# Patient Record
Sex: Male | Born: 1975 | Race: White | Hispanic: No | Marital: Married | Smoking: Former smoker
Health system: Southern US, Community
[De-identification: ages and names within clinical notes are randomized; demographics above are authoritative.]

## PROBLEM LIST (undated history)

## (undated) DIAGNOSIS — E785 Hyperlipidemia, unspecified: Secondary | ICD-10-CM

## (undated) DIAGNOSIS — I2510 Atherosclerotic heart disease of native coronary artery without angina pectoris: Secondary | ICD-10-CM

## (undated) DIAGNOSIS — I10 Essential (primary) hypertension: Secondary | ICD-10-CM

## (undated) HISTORY — DX: Hyperlipidemia, unspecified: E78.5

## (undated) HISTORY — DX: Atherosclerotic heart disease of native coronary artery without angina pectoris: I25.10

## (undated) HISTORY — DX: Essential (primary) hypertension: I10

---

## 2001-07-22 HISTORY — PX: SHOULDER SURGERY: SHX246

## 2009-01-31 ENCOUNTER — Encounter: Payer: Self-pay | Admitting: Internal Medicine

## 2009-02-02 ENCOUNTER — Inpatient Hospital Stay: Payer: Self-pay | Admitting: Internal Medicine

## 2009-02-14 ENCOUNTER — Encounter: Payer: Self-pay | Admitting: Internal Medicine

## 2009-02-20 ENCOUNTER — Telehealth (INDEPENDENT_AMBULATORY_CARE_PROVIDER_SITE_OTHER): Payer: Self-pay | Admitting: *Deleted

## 2009-02-21 ENCOUNTER — Ambulatory Visit: Payer: Self-pay

## 2009-02-21 ENCOUNTER — Encounter: Payer: Self-pay | Admitting: Cardiovascular Disease

## 2009-02-23 ENCOUNTER — Ambulatory Visit: Payer: Self-pay | Admitting: Internal Medicine

## 2009-02-23 DIAGNOSIS — I10 Essential (primary) hypertension: Secondary | ICD-10-CM | POA: Insufficient documentation

## 2009-02-23 DIAGNOSIS — E785 Hyperlipidemia, unspecified: Secondary | ICD-10-CM | POA: Insufficient documentation

## 2009-02-23 DIAGNOSIS — I2510 Atherosclerotic heart disease of native coronary artery without angina pectoris: Secondary | ICD-10-CM | POA: Insufficient documentation

## 2009-03-22 ENCOUNTER — Encounter: Payer: Self-pay | Admitting: Internal Medicine

## 2009-07-18 ENCOUNTER — Telehealth (INDEPENDENT_AMBULATORY_CARE_PROVIDER_SITE_OTHER): Payer: Self-pay | Admitting: *Deleted

## 2009-07-25 ENCOUNTER — Ambulatory Visit: Payer: Self-pay | Admitting: Internal Medicine

## 2009-08-28 ENCOUNTER — Encounter: Payer: Self-pay | Admitting: Internal Medicine

## 2010-04-27 ENCOUNTER — Encounter: Payer: Self-pay | Admitting: Internal Medicine

## 2010-05-25 ENCOUNTER — Ambulatory Visit: Payer: Self-pay | Admitting: Internal Medicine

## 2010-05-29 ENCOUNTER — Ambulatory Visit: Payer: Self-pay | Admitting: Family Medicine

## 2010-05-29 ENCOUNTER — Encounter: Admission: RE | Admit: 2010-05-29 | Discharge: 2010-05-29 | Payer: Self-pay | Admitting: Family Medicine

## 2010-05-29 DIAGNOSIS — M461 Sacroiliitis, not elsewhere classified: Secondary | ICD-10-CM | POA: Insufficient documentation

## 2010-05-29 DIAGNOSIS — M545 Low back pain, unspecified: Secondary | ICD-10-CM | POA: Insufficient documentation

## 2010-08-21 NOTE — Assessment & Plan Note (Signed)
Summary: NP,BACK PAIN,MC   Vital Signs:  Patient profile:   35 year old male BP sitting:   114 / 74  Vitals Entered By: Lillia Pauls CMA (May 29, 2010 2:42 PM)  Primary Provider:  Johnella Moloney   History of Present Illness: Dr. Nicholes Mango requested that we evaluate this gentleman and [facility] for  back pain has been ongoing for about the last month.  The patient is a highly active 35 year old gentleman,  who does have a history of a NSTEMI in the past a very young age, however he is very active.  He was doing high repetition power cleans her across the training about a month ago and felt a sudden  acute pain in his RIGHT lower back.  Since that time, he has been unable to lift, and  has done minimal  in terms of his physical activity.Marland Kitchen  He is not having a great deal of pain at baseline, but he does have some pain, notably with flexion forward at the waist, and has been unable to do any  loading activities such as lunges and squats.  No history prior back injury.   Dr. Gala Romney gave him a few days of steroids, and some Flexeril to be used as needed with also some occasional Percocet. He is still having some symptoms.  He also does  drive and  sitting his car  much of the day  during the work week.  Allergies: 1)  ! Penicillin 2)  ! Ibuprofen  Past History:  Past medical, surgical, family and social histories (including risk factors) reviewed, and no changes noted (except as noted below).  Past Medical History: Reviewed history from 02/23/2009 and no changes required. HYPERTENSION, UNSPECIFIED (ICD-401.9) HYPERLIPIDEMIA-MIXED (ICD-272.4) CAD    --small NSTEMI in setting of heat stroke 7/10    --Cath 50-60% LAD; 7/10    --ET/Myoview: EF 71% no ischemia  7/10 Strong FHX CAD  Past Surgical History: Reviewed history from 02/23/2009 and no changes required. shoulder - '03  Family History: Reviewed history from 02/23/2009 and no changes required. M 58  ok F 52  premature CAD. died during ICD implant. first MI 74 Brother 34 ok PGF MI CAD died at 25  Social History: Reviewed history from 02/23/2009 and no changes required. Full Time Married  Tobacco Use - Former.  Alcohol Use - yes - occasional Regular Exercise - yes Drug Use - no  Review of Systems       REVIEW OF SYSTEMS  GEN: No systemic complaints, no fevers, chills, sweats, or other acute illnesses MSK: Detailed in the HPI GI: tolerating PO intake without difficulty Neuro: No numbness, parasthesias, or tingling associated. Otherwise the pertinent positives of the ROS are noted above.    Physical Exam  Additional Exam:  Gen: Well-developed,well-nourished,in no acute distress; alert,appropriate and cooperative throughout examination HEENT: Normocephalic and atraumatic without obvious abnormalities.  Ears, externally no deformities Pulm: Breathing comfortably in no respiratory distress Range of motion at  the waist: Flexion: mild - mod pain, loss fo approx 30 deg Extension: no pain Rotation: no pain  No echymosis or edema Rises to examination table with no difficulty Gait:non antalgic   Inspection/Deformity: N Paraspinus T: Y, mild TTP R  B Ankle Dorsiflexion (L5,4): 5/5 B Great Toe Dorsiflexion (L5,4): 5/5 Heel Walk (L5): WNL Toe Walk (S1): WNL Rise/Squat (L4): WNL  SENSORY B Medial Foot (L4): WNL B Dorsum (L5): WNL B Lateral (S1): WNL Light Touch: WNL Pinprick: WNL  REFLEXES Knee (L4): 2+ Ankle (S1): 2+  B SLR, seated: neg B SLR, supine: neg B FABER: neg B Reverse FABER: neg B Greater Troch: NT B Log Roll: neg B Stork: NT B Sciatic Notch: TTP on the RIGHT Leg Lengths: equal    Impression & Recommendations:  Problem # 1:  BACK PAIN, LUMBAR (ICD-724.2) Assessment New lumbar spine films have returned at this time, and the patient has no evidence of a pars defect on his oblique films. Otherwise his spine films are essentially unremarkable.   Question RIGHT sacroiliitis, which does correlate with his SI joint notch tenderness on examination.  Probable facet involvement on the RIGHT, with sacroiliitis. Extent steroids for a few days, and  I initiated back rehabilitation and SI protocol. Patient is quite savvy with his understanding of  athletics and weightlifting, suspect he will do well with this.  Recheck in a month. If not improved at that time,  he would be a very good candidate for manipulation.  cc: Dr. Gala Romney    Orders: Radiology other (Radiology Other)  Problem # 2:  SACROILIITIS, RIGHT (ICD-720.2) Assessment: New  Complete Medication List: 1)  Aspirin Ec 325 Mg Tbec (Aspirin) .... Take one tablet by mouth daily 2)  Crestor 40 Mg Tabs (Rosuvastatin calcium) .... Take one tablet by mouth daily. 3)  Lisinopril 10 Mg Tabs (Lisinopril) .... Take one tablet by mouth daily 4)  Metoprolol Succinate 25 Mg Xr24h-tab (Metoprolol succinate) .... Take 1/2 tablet by mouth daily 5)  Fenofibrate 54 Mg Tabs (Fenofibrate) .... Once daily 6)  Multivitamins Tabs (Multiple vitamin) .... Once daily 7)  Fish Oil Oil (Fish oil) .... 1000mg  two times a day 8)  Prednisone 20 Mg Tabs (Prednisone) .... 2 tabs by mouth daily x 4 days, then 1 tab by mouth daily x 4 days 9)  Flexeril 10 Mg Tabs (Cyclobenzaprine hcl) .... Take 1 tablet by mouth two times a day for 7 days 10)  Percocet 5-325 Mg Tabs (Oxycodone-acetaminophen) .... Take 2 tabs by mouth every 6 hours as needed for pain  Other Orders: Consultation Level III (16109)  Patient Instructions: 1)  ok to swim, ride bike 2)  do any lifts that do not hurt 3)  WORK ON REHAB EVERY DAY 4)  RECHECK 4 WEEKS Prescriptions: PREDNISONE 20 MG TABS (PREDNISONE) 2 tabs by mouth daily x 4 days, then 1 tab by mouth daily x 4 days  #12 x 0   Entered and Authorized by:   Hannah Beat MD   Signed by:   Hannah Beat MD on 05/29/2010   Method used:   Electronically to        [id number] (retail)       [address]       [place], [place]  [address]       Ph: [phone]       Fax: [phone]   RxID:   [id number]    Orders Added: 1)  Radiology other [Radiology Other] 2)  Consultation Level III [02725]

## 2010-08-21 NOTE — Assessment & Plan Note (Signed)
Summary: F9M/AMD   Visit Type:  Follow-up Primary Provider:  Johnella Moloney  CC:  "Doing well"..  History of Present Illness: 35 y/o male with h/o HTN, HL, strong family history of CAD, CAD with NSTEMI7/10  in setting of heat stroke. Cath from 7/10 mid LAD lesion  50-60%. ETT/Myoview. He exercised for 12:00 on Bruce protocol. EF 71%. No ischemia.  Returns for routine f/u. Doing very well.  Very active with weight lifting and aerobic activity (going to [employer]). No CP or SOB. A couple of weeks ago hurt his back and still having low back pain. No sciatica symptoms. Pain not responding to Aleve. Hard to get up out of chair.   Lipids from October 2011: TC 126 TG 112 HDL 47 LDL 58.    Current Medications (verified): 1)  Aspirin Ec 325 Mg Tbec (Aspirin) .... Take One Tablet By Mouth Daily 2)  Crestor 40 Mg Tabs (Rosuvastatin Calcium) .... Take One Tablet By Mouth Daily. 3)  Lisinopril 10 Mg Tabs (Lisinopril) .... Take One Tablet By Mouth Daily 4)  Metoprolol Succinate 25 Mg Xr24h-Tab (Metoprolol Succinate) .... Take 1/2 Tablet By Mouth Daily 5)  Fenofibrate 54 Mg Tabs (Fenofibrate) .... Once Daily 6)  Multivitamins   Tabs (Multiple Vitamin) .... Once Daily 7)  Fish Oil   Oil (Fish Oil) .... 1000mg  Two Times A Day  Allergies (verified): 1)  ! Penicillin 2)  ! Ibuprofen  Past History:  Past Medical History: Last updated: 02/23/2009 HYPERTENSION, UNSPECIFIED (ICD-401.9) HYPERLIPIDEMIA-MIXED (ICD-272.4) CAD    --small NSTEMI in setting of heat stroke 7/10    --Cath 50-60% LAD; 7/10    --ET/Myoview: EF 71% no ischemia  7/10 Strong FHX CAD  Past Surgical History: Last updated: 02/23/2009 shoulder - '03  Family History: Last updated: 02/23/2009 M 62 ok F 52  premature CAD. died during ICD implant. first MI 17 Brother 61 ok PGF MI CAD died at 18  Social History: Last updated: 02/23/2009 Full Time Married  Tobacco Use - Former.  Alcohol Use - yes - occasional Regular  Exercise - yes Drug Use - no  Risk Factors: Exercise: yes (02/23/2009)  Risk Factors: Smoking Status: quit (02/23/2009)  Review of Systems       As per HPI and past medical history; otherwise all systems negative.   Vital Signs:  Patient profile:   35 year old male Height:      70 inches Weight:      199 pounds BMI:     28.66 Pulse rate:   58 / minute BP sitting:   120 / 70  (left arm) Cuff size:   large  Vitals Entered By: Bishop Dublin, CMA (May 25, 2010 11:15 AM)  Physical Exam  General:  Well appearing. uncomfortable due to back pain. no resp difficulty HEENT: normal Neck: supple. no JVD. Carotids 2+ bilat; not bruits. No lymphadenopathy or thryomegaly appreciated. Cor: PMI nondisplaced. Regular rate & rhythm. No rubs, gallops, murmur. Lungs: clear Abdomen: soft, nontender, nondistended. No hepatosplenomegaly. No bruits or masses. Good bowel sounds. Extremities: no cyanosis, clubbing, rash, edema. SLR - bilat Neuro: alert & orientedx3, cranial nerves grossly intact. moves all 4 extremities w/o difficulty. affect pleasant    Impression & Recommendations:  Problem # 1:  CAD, NATIVE VESSEL (ICD-414.01) Stable. No evidence of ischemia. Continue current regimen.  Problem # 2:  HYPERLIPIDEMIA-MIXED (ICD-272.4) Lipids look great. LDL at goal < 70. Continue current regimen.  Problem # 3:  Back pain Will give prednisone burst  40qd x 3days, flexeril and percocet. F/u with Dr. Darrick Penna in sports medicine if symptoms persist.  Patient Instructions: 1)  Your physician has recommended you make the following change in your medication: START prednison, flexaril and percocet for back pain 2)  You have been referred to Enid Baas

## 2010-08-21 NOTE — Assessment & Plan Note (Signed)
Summary: F4M/AMD   Primary Provider:  Johnella Moloney  CC:  no complaints.  History of Present Illness: 35 y/o male with h/o HTN, HL, strong family history of CAD, CAD with NSTEMI7/10  in setting of heat stroke. Cath from 7/10 mid LAD lesion  50-60%. ETT/Myoview. He exercised for 12:00 on Bruce protocol. EF 71%. No ischemia.  Returns for routine f/u. Doing very well. Runs about 1.5 miles 3x/week without any CP or SOB. Was initially watching diet very closely but now not as closely. Has lost about 10 pounds.  Lipids from September: TC 103 TG 66 HDL 44 LDL 52.    Medications Prior to Update: 1)  Aspirin Ec 325 Mg Tbec (Aspirin) .... Take One Tablet By Mouth Daily 2)  Crestor 40 Mg Tabs (Rosuvastatin Calcium) .... Take One Tablet By Mouth Daily. 3)  Lisinopril 10 Mg Tabs (Lisinopril) .... Take One Tablet By Mouth Daily 4)  Metoprolol Succinate 25 Mg Xr24h-Tab (Metoprolol Succinate) .... Take One Tablet By Mouth Daily 5)  Fenofibrate 54 Mg Tabs (Fenofibrate) .... Once Daily  Current Medications (verified): 1)  Aspirin Ec 325 Mg Tbec (Aspirin) .... Take One Tablet By Mouth Daily 2)  Crestor 40 Mg Tabs (Rosuvastatin Calcium) .... Take One Tablet By Mouth Daily. 3)  Lisinopril 10 Mg Tabs (Lisinopril) .... Take One Tablet By Mouth Daily 4)  Metoprolol Succinate 25 Mg Xr24h-Tab (Metoprolol Succinate) .... Take 1/2 Tablet By Mouth Daily 5)  Fenofibrate 54 Mg Tabs (Fenofibrate) .... Once Daily 6)  Multivitamins   Tabs (Multiple Vitamin) .... Once Daily 7)  Fish Oil   Oil (Fish Oil) .... 1000mg  Two Times A Day  Allergies (verified): 1)  ! Penicillin 2)  ! Ibuprofen  Past History:  Past Medical History: Last updated: 02/23/2009 HYPERTENSION, UNSPECIFIED (ICD-401.9) HYPERLIPIDEMIA-MIXED (ICD-272.4) CAD    --small NSTEMI in setting of heat stroke 7/10    --Cath 50-60% LAD; 7/10    --ET/Myoview: EF 71% no ischemia  7/10 Strong FHX CAD  Review of Systems       As per HPI and past  medical history; otherwise all systems negative.   Vital Signs:  Patient profile:   35 year old male Height:      70 inches Weight:      192 pounds BMI:     27.65 Pulse rate:   67 / minute BP sitting:   118 / 62  (right arm) Cuff size:   large  Vitals Entered By: Hardin Negus, RMA (July 25, 2009 2:59 PM)  Physical Exam  General:  Gen: well appearing. no resp difficulty HEENT: normal Neck: supple. no JVD. Carotids 2+ bilat; not bruits. No lymphadenopathy or thryomegaly appreciated. Cor: PMI nondisplaced. Regular rate & rhythm. No rubs, gallops, murmur. Lungs: clear Abdomen: soft, nontender, nondistended. No hepatosplenomegaly. No bruits or masses. Good bowel sounds. Extremities: no cyanosis, clubbing, rash, edema Neuro: alert & orientedx3, cranial nerves grossly intact. moves all 4 extremities w/o difficulty. affect pleasant    Impression & Recommendations:  Problem # 1:  CAD, NATIVE VESSEL (ICD-414.01) Stable. No evidence of ischemia. Continue current regimen.  Problem # 2:  HYPERTENSION, UNSPECIFIED (ICD-401.9) Blood pressure well controlled. Continue current regimen.  Problem # 3:  HYPERLIPIDEMIA-MIXED (ICD-272.4) Lipids look great. LDL at goal < 70. Continue current regimen.  Patient Instructions: 1)  Return to clinic in 9 months.

## 2010-12-23 ENCOUNTER — Other Ambulatory Visit: Payer: Self-pay | Admitting: Internal Medicine

## 2011-01-07 ENCOUNTER — Encounter: Payer: Self-pay | Admitting: Cardiovascular Disease

## 2011-05-12 ENCOUNTER — Other Ambulatory Visit: Payer: Self-pay | Admitting: Internal Medicine

## 2011-05-13 ENCOUNTER — Other Ambulatory Visit: Payer: Self-pay | Admitting: Internal Medicine

## 2011-05-13 MED ORDER — METOPROLOL SUCCINATE ER 25 MG PO TB24: 12.5000 mg | ORAL_TABLET | Freq: Every day | ORAL | Status: DC

## 2011-08-13 ENCOUNTER — Ambulatory Visit (INDEPENDENT_AMBULATORY_CARE_PROVIDER_SITE_OTHER): Payer: 59 | Admitting: Internal Medicine

## 2011-08-13 ENCOUNTER — Encounter: Payer: Self-pay | Admitting: Internal Medicine

## 2011-08-13 VITALS — BP 118/68 | HR 71 | Ht 71.0 in | Wt 200.0 lb

## 2011-08-13 DIAGNOSIS — I2510 Atherosclerotic heart disease of native coronary artery without angina pectoris: Secondary | ICD-10-CM

## 2011-08-13 DIAGNOSIS — E785 Hyperlipidemia, unspecified: Secondary | ICD-10-CM

## 2011-08-13 NOTE — Progress Notes (Signed)
   HPI:  Lance Terry is a 36 y/o male with h/o HTN, HL, strong family history of CAD, CAD with NSTEMI 7/10  in setting of heat stroke. Cath from 7/10 mid LAD lesion  50-60%. ETT/Myoview. He exercised for 12:00 on Bruce protocol. EF 71%. No ischemia.  Returns for routine f/u. Doing well. Not as active any more due to new job and 1 young child and another one on the way.  No CP or SOB. Still taking Crestor and all the rest of his heart meds.   Following with Johnella Moloney and lipids have been great.   ROS: All systems negative except as listed in HPI, PMH and Problem List.  Past Medical History  Diagnosis Date  . Hypertension   . Hyperlipidemia     mixed  . Coronary artery disease     -small NSTEMI in setting of heat stroke 7/10 -Cath 50-60% LAD; 7/10 -ET.Celine Ahr: EF 71% no ischemia 7/10     Current Outpatient Prescriptions  Medication Sig Dispense Refill  . aspirin EC 325 MG tablet Take 325 mg by mouth daily.        . CRESTOR 40 MG tablet TAKE 1 TABLET DAILY  90 tablet  2  . fenofibrate 54 MG tablet TAKE 1 TABLET DAILY  90 tablet  3  . lisinopril (PRINIVIL,ZESTRIL) 10 MG tablet TAKE 1 TABLET DAILY  90 tablet  2  . metoprolol succinate (TOPROL-XL) 25 MG 24 hr tablet Take 0.5 tablets (12.5 mg total) by mouth daily.  45 tablet  3  . Multiple Vitamins-Minerals (MULTIVITAL) tablet Take 1 tablet by mouth daily.        . Omega-3 Fatty Acids (FISH OIL) 1000 MG CAPS Take by mouth 2 (two) times daily.           PHYSICAL EXAM: Filed Vitals:   08/13/11 1142  BP: 118/68  Pulse: 71   General:  Well appearing. No resp difficulty HEENT: normal Neck: supple. JVP flat. Carotids 2+ bilaterally; no bruits. No lymphadenopathy or thryomegaly appreciated. Cor: PMI normal. Regular rate & rhythm. No rubs, gallops or murmurs. Lungs: clear Abdomen: soft, nontender, nondistended. No hepatosplenomegaly. No bruits or masses. Good bowel sounds. Extremities: no cyanosis, clubbing, rash, edema Neuro: alert &  orientedx3, cranial nerves grossly intact. Moves all 4 extremities w/o difficulty. Affect pleasant.    ECG: NSR 71 No ST-T wave abnormalities.     ASSESSMENT & PLAN:

## 2011-08-13 NOTE — Assessment & Plan Note (Signed)
Followed by Dr. Kevan Ny. LDL at goal < 70. Continue statin.

## 2011-08-13 NOTE — Patient Instructions (Addendum)
Your physician wants you to follow-up in: 12 months at the Heart Failure Clinic at [facility].  [phone]) You will receive a reminder letter in the mail two months in advance. If you don't receive a letter, please call our office to schedule the follow-up appointment.  Your physician has recommended you make the following change in your medication: Decrease to a baby aspirin (81mg ) daily

## 2011-08-13 NOTE — Assessment & Plan Note (Addendum)
Doing well.  Encouraged him to get more exercise. Given strong FHX of CAD and borderline lesion have suggested that we re-image at some point to assess for disease progression/regression. Will plan cardiac CT at next visit. Decrease ASA 81 daily.

## 2012-03-06 ENCOUNTER — Other Ambulatory Visit: Payer: Self-pay

## 2012-03-06 MED ORDER — METOPROLOL SUCCINATE ER 25 MG PO TB24: 12.5000 mg | ORAL_TABLET | Freq: Every day | ORAL | Status: DC

## 2012-03-10 ENCOUNTER — Other Ambulatory Visit: Payer: Self-pay | Admitting: *Deleted

## 2012-03-10 MED ORDER — ROSUVASTATIN CALCIUM 40 MG PO TABS: 40.0000 mg | ORAL_TABLET | Freq: Every day | ORAL | Status: DC

## 2012-03-10 MED ORDER — LISINOPRIL 10 MG PO TABS: 10.0000 mg | ORAL_TABLET | Freq: Every day | ORAL | Status: DC

## 2012-03-20 ENCOUNTER — Other Ambulatory Visit: Payer: Self-pay

## 2012-03-20 MED ORDER — FENOFIBRATE 54 MG PO TABS: 54.0000 mg | ORAL_TABLET | Freq: Every day | ORAL | Status: DC

## 2012-03-25 ENCOUNTER — Other Ambulatory Visit: Payer: Self-pay | Admitting: *Deleted

## 2012-03-25 MED ORDER — METOPROLOL SUCCINATE ER 25 MG PO TB24: 12.5000 mg | ORAL_TABLET | Freq: Every day | ORAL | Status: DC

## 2012-06-16 ENCOUNTER — Encounter: Payer: Self-pay | Admitting: Internal Medicine

## 2012-08-06 ENCOUNTER — Other Ambulatory Visit (HOSPITAL_COMMUNITY): Payer: Self-pay | Admitting: *Deleted

## 2012-08-06 MED ORDER — METOPROLOL SUCCINATE ER 25 MG PO TB24: 12.5000 mg | ORAL_TABLET | Freq: Every day | ORAL | Status: DC

## 2012-08-06 MED ORDER — FENOFIBRATE 54 MG PO TABS: 54.0000 mg | ORAL_TABLET | Freq: Every day | ORAL | Status: DC

## 2012-09-11 ENCOUNTER — Encounter (HOSPITAL_COMMUNITY): Payer: Self-pay | Admitting: Cardiology

## 2012-09-11 ENCOUNTER — Telehealth (HOSPITAL_COMMUNITY): Payer: Self-pay | Admitting: Cardiology

## 2012-09-11 NOTE — Telephone Encounter (Signed)
Attempting to call pt for follow up. Number unavailable.I have been unable to reach this patient by phone.  A letter is being sent to the last known address

## 2012-09-11 NOTE — Telephone Encounter (Signed)
Message copied by JEFFRIES, Milagros Reap on Fri Sep 11, 2012  4:51 PM ------      Message from: Noralee Space      Created: Thu Aug 06, 2012  3:58 PM       Hey this guy was last seen by Dr Gala Romney in Jan of last year at [facility] and he is do for yearly f/u with [place] can you please schedule, thanks ------

## 2012-10-26 ENCOUNTER — Encounter (HOSPITAL_COMMUNITY): Payer: Self-pay

## 2012-10-26 ENCOUNTER — Ambulatory Visit (HOSPITAL_COMMUNITY)
Admission: RE | Admit: 2012-10-26 | Discharge: 2012-10-26 | Disposition: A | Discharge status: Home / Self Care | Payer: 59 | Source: Ambulatory Visit | Attending: Internal Medicine | Admitting: Internal Medicine

## 2012-10-26 VITALS — BP 152/98 | HR 76 | Wt 208.1 lb

## 2012-10-26 DIAGNOSIS — R0683 Snoring: Secondary | ICD-10-CM

## 2012-10-26 DIAGNOSIS — E782 Mixed hyperlipidemia: Secondary | ICD-10-CM | POA: Insufficient documentation

## 2012-10-26 DIAGNOSIS — R0609 Other forms of dyspnea: Secondary | ICD-10-CM

## 2012-10-26 DIAGNOSIS — I10 Essential (primary) hypertension: Secondary | ICD-10-CM

## 2012-10-26 DIAGNOSIS — I2510 Atherosclerotic heart disease of native coronary artery without angina pectoris: Secondary | ICD-10-CM

## 2012-10-26 DIAGNOSIS — R0989 Other specified symptoms and signs involving the circulatory and respiratory systems: Secondary | ICD-10-CM

## 2012-10-26 DIAGNOSIS — E785 Hyperlipidemia, unspecified: Secondary | ICD-10-CM

## 2012-10-26 MED ORDER — LISINOPRIL 20 MG PO TABS: 20.0000 mg | ORAL_TABLET | Freq: Every day | ORAL | Status: DC

## 2012-10-26 NOTE — Patient Instructions (Addendum)
Increase lisinopril to 2 tabs (20 mg) until new prescription is delivered.  Follow up with Dr. Gala Romney in 1 year.

## 2012-10-26 NOTE — Progress Notes (Signed)
PCP: Dr. Kevan Ny  HPI:  Lance Terry is a 37 y/o male with h/o HTN, HL, strong family history of CAD, CAD with NSTEMI 7/10  in setting of heat stroke. Cath from 7/10 mid LAD lesion  50-60%.   02/2009 ETT/Myoview. He exercised for 12:00 on Bruce protocol. EF 71%. No ischemia.  05/2102: TC 174, Trig 166, HLD 43, and LDL 109 05/2012 K 4.5, Cr 2.13  He returns for follow up today.  He has been doing well.  He has not been going to the gym as he has a 38 yr old and 38 yr old.  He had an episode of heart burn that last 5 hours associated with nausea.   He does jog around with his dog 3-4 x/week without chest pain.  No SOB.    ROS: All systems negative except as listed in HPI, PMH and Problem List.  Past Medical History  Diagnosis Date  . Hypertension   . Hyperlipidemia     mixed  . Coronary artery disease     -small NSTEMI in setting of heat stroke 7/10 -Cath 50-60% LAD; 7/10 -ET.Celine Ahr: EF 71% no ischemia 7/10     Current Outpatient Prescriptions  Medication Sig Dispense Refill  . aspirin 81 MG tablet Take 81 mg by mouth daily.      . fenofibrate 54 MG tablet Take 1 tablet (54 mg total) by mouth daily.  90 tablet  1  . lisinopril (PRINIVIL,ZESTRIL) 10 MG tablet Take 1 tablet (10 mg total) by mouth daily.  90 tablet  3  . metoprolol succinate (TOPROL-XL) 25 MG 24 hr tablet Take 0.5 tablets (12.5 mg total) by mouth daily.  45 tablet  1  . Multiple Vitamins-Minerals (MULTIVITAL) tablet Take 1 tablet by mouth daily.        . Omega-3 Fatty Acids (FISH OIL) 1000 MG CAPS Take by mouth 2 (two) times daily.        . rosuvastatin (CRESTOR) 40 MG tablet Take 1 tablet (40 mg total) by mouth daily.  90 tablet  3   No current facility-administered medications for this encounter.     PHYSICAL EXAM: Filed Vitals:   10/26/12 1540  BP: 152/98  Pulse: 76  Weight: 208 lb 1.6 oz (94.394 kg)  SpO2: 99%   General:  Well appearing. No resp difficulty HEENT: normal Neck: supple. JVP flat. Carotids 2+  bilaterally; no bruits. No lymphadenopathy or thryomegaly appreciated. Cor: PMI normal. Regular rate & rhythm. No rubs, gallops or murmurs. Lungs: clear Abdomen: soft, nontender, nondistended. No hepatosplenomegaly. No bruits or masses. Good bowel sounds. Extremities: no cyanosis, clubbing, rash, edema Neuro: alert & orientedx3, cranial nerves grossly intact. Moves all 4 extremities w/o difficulty. Affect pleasant.    ASSESSMENT & PLAN:

## 2012-10-27 DIAGNOSIS — R0683 Snoring: Secondary | ICD-10-CM | POA: Insufficient documentation

## 2012-10-27 NOTE — Assessment & Plan Note (Signed)
BP up. Will increase lisinopril to 20 daily.

## 2012-10-27 NOTE — Assessment & Plan Note (Signed)
I worry he is developing OSA. Will attempt weight loss. If not successful, will need sleep study.

## 2012-10-27 NOTE — Assessment & Plan Note (Signed)
Patient seen and examined with Ulyess Blossom, PA-C. We discussed all aspects of the encounter. I agree with the assessment as stated above. My thoughts below. We reviewed his previous cath which showed moderate non-obstructive CAD. He had an episode of CP recently in the setting of GI symptoms. We discussed the need for further risk stratification with ETT or cardiac CT. We have decided to pursue ETT. If + will need relook cath. Also discussed need for lifestyle modification efforts including more exercise and weight loss.

## 2012-10-27 NOTE — Assessment & Plan Note (Signed)
Followed by Dr. Kevan Ny. On good regimen. Emphasized need for exercise and weight loss.

## 2012-11-17 ENCOUNTER — Ambulatory Visit (INDEPENDENT_AMBULATORY_CARE_PROVIDER_SITE_OTHER): Payer: 59 | Admitting: Physician Assistant

## 2012-11-17 DIAGNOSIS — I2510 Atherosclerotic heart disease of native coronary artery without angina pectoris: Secondary | ICD-10-CM

## 2012-11-17 DIAGNOSIS — R079 Chest pain, unspecified: Secondary | ICD-10-CM

## 2012-11-17 NOTE — Progress Notes (Signed)
Exercise Treadmill Test  Pre-Exercise Testing Evaluation Rhythm: normal sinus  Rate: 72                 Test  Exercise Tolerance Test Ordering MD: Arvilla Meres, MD  Interpreting MD: Tereso Newcomer, PA-C  Unique Test No: 1  Treadmill:  1  Indication for ETT: chest pain - rule out ischemia  Contraindication to ETT: No   Stress Modality: exercise - treadmill  Cardiac Imaging Performed: non   Protocol: standard Bruce - maximal  Max BP:  205/79  Max MPHR (bpm):  183 85% MPR (bpm):  156  MPHR obtained (bpm):  181 % MPHR obtained:  98%  Reached 85% MPHR (min:sec):  7:12 Total Exercise Time (min-sec):  11:01  Workload in METS:  13.3 Borg Scale: 17  Reason ETT Terminated:  patient's desire to stop    ST Segment Analysis At Rest: normal ST segments - no evidence of significant ST depression With Exercise: no evidence of significant ST depression  Other Information Arrhythmia:  No Angina during ETT:  absent (0) Quality of ETT:  diagnostic  ETT Interpretation:  normal - no evidence of ischemia by ST analysis  Comments: Excellent exercise tolerance. No chest pain. Normal BP response to exercise. No ST-T changes to suggest ischemia.   Recommendations: F/u Dr. Arvilla Meres as directed. Luna Glasgow, PA-C  4:00 PM 11/17/2012

## 2012-11-17 NOTE — Progress Notes (Signed)
Normal

## 2013-06-29 ENCOUNTER — Other Ambulatory Visit (HOSPITAL_COMMUNITY): Payer: Self-pay | Admitting: Internal Medicine

## 2013-06-30 ENCOUNTER — Other Ambulatory Visit (HOSPITAL_COMMUNITY): Payer: Self-pay | Admitting: *Deleted

## 2013-06-30 MED ORDER — ROSUVASTATIN CALCIUM 40 MG PO TABS: 40.0000 mg | ORAL_TABLET | Freq: Every day | ORAL | Status: DC

## 2013-08-27 ENCOUNTER — Encounter: Payer: Self-pay | Admitting: Internal Medicine

## 2013-11-18 ENCOUNTER — Encounter (HOSPITAL_COMMUNITY): Payer: 59

## 2013-11-23 ENCOUNTER — Other Ambulatory Visit (HOSPITAL_COMMUNITY): Payer: Self-pay | Admitting: Cardiology

## 2013-11-23 DIAGNOSIS — I2510 Atherosclerotic heart disease of native coronary artery without angina pectoris: Secondary | ICD-10-CM

## 2013-11-23 DIAGNOSIS — I10 Essential (primary) hypertension: Secondary | ICD-10-CM

## 2013-11-23 DIAGNOSIS — E785 Hyperlipidemia, unspecified: Secondary | ICD-10-CM

## 2013-11-23 MED ORDER — FENOFIBRATE 54 MG PO TABS: ORAL_TABLET | ORAL | Status: DC

## 2013-11-23 MED ORDER — METOPROLOL SUCCINATE ER 25 MG PO TB24: 12.5000 mg | ORAL_TABLET | Freq: Every day | ORAL | Status: DC

## 2013-11-23 MED ORDER — LISINOPRIL 20 MG PO TABS: 20.0000 mg | ORAL_TABLET | Freq: Every day | ORAL | Status: DC

## 2013-11-23 MED ORDER — ROSUVASTATIN CALCIUM 40 MG PO TABS: 40.0000 mg | ORAL_TABLET | Freq: Every day | ORAL | Status: DC

## 2013-11-24 ENCOUNTER — Encounter (HOSPITAL_COMMUNITY): Payer: 59

## 2013-12-02 ENCOUNTER — Ambulatory Visit (HOSPITAL_COMMUNITY): Payer: 59

## 2013-12-15 ENCOUNTER — Ambulatory Visit (HOSPITAL_COMMUNITY)
Admission: RE | Admit: 2013-12-15 | Discharge: 2013-12-15 | Disposition: A | Discharge status: Home / Self Care | Payer: Managed Care, Other (non HMO) | Source: Ambulatory Visit | Attending: Internal Medicine | Admitting: Internal Medicine

## 2013-12-15 ENCOUNTER — Encounter (HOSPITAL_COMMUNITY): Payer: Self-pay

## 2013-12-15 VITALS — BP 122/82 | HR 90 | Wt 195.0 lb

## 2013-12-15 DIAGNOSIS — I252 Old myocardial infarction: Secondary | ICD-10-CM | POA: Insufficient documentation

## 2013-12-15 DIAGNOSIS — E785 Hyperlipidemia, unspecified: Secondary | ICD-10-CM

## 2013-12-15 DIAGNOSIS — E782 Mixed hyperlipidemia: Secondary | ICD-10-CM | POA: Insufficient documentation

## 2013-12-15 DIAGNOSIS — I2510 Atherosclerotic heart disease of native coronary artery without angina pectoris: Secondary | ICD-10-CM

## 2013-12-15 DIAGNOSIS — Z8249 Family history of ischemic heart disease and other diseases of the circulatory system: Secondary | ICD-10-CM | POA: Insufficient documentation

## 2013-12-15 DIAGNOSIS — Z7982 Long term (current) use of aspirin: Secondary | ICD-10-CM | POA: Insufficient documentation

## 2013-12-15 DIAGNOSIS — I10 Essential (primary) hypertension: Secondary | ICD-10-CM

## 2013-12-15 NOTE — Patient Instructions (Signed)
We will contact you in 1 year to schedule your next appointment.

## 2013-12-15 NOTE — Progress Notes (Signed)
PCP: Dr. Inda Merlin  HPI:  Lance Terry is a 38 y/o male with h/o HTN, HL, strong family history of CAD, CAD with NSTEMI 7/10  in setting of heat stroke. Cath from 7/10 mid LAD lesion  50-60%.   02/2009 ETT/Myoview. He exercised for 12:00 on Bruce protocol. EF 71%. No ischemia. 10/2012: ETT He exercised for 11:01 on Bruce protocol. Peak HR 181 No ischemia.  05/2102: TC 174, Trig 166, HLD 43, and LDL 109 05/2012 K 4.5, Cr 0.98  He returns for follow up today. He has been doing well.  Dr. Inda Merlin has been following his cholesterol. On Crestor knees and ankles pop. Works as a Educational psychologist (hysteroscope).  He has not been going to the gym as he has a 39 yr old and 39 yr old.  No CP or SOB.   ROS: All systems negative except as listed in HPI, PMH and Problem List.  Past Medical History  Diagnosis Date  . Hypertension   . Hyperlipidemia     mixed  . Coronary artery disease     -small NSTEMI in setting of heat stroke 7/10 -Cath 50-60% LAD; 7/10 -ET.Lance Terry: EF 71% no ischemia 7/10     Current Outpatient Prescriptions  Medication Sig Dispense Refill  . aspirin 81 MG tablet Take 81 mg by mouth daily.      . fenofibrate 54 MG tablet Take 1 tablet by mouth  daily  30 tablet  6  . lisinopril (PRINIVIL,ZESTRIL) 20 MG tablet Take 1 tablet (20 mg total) by mouth daily.  30 tablet  6  . metoprolol succinate (TOPROL-XL) 25 MG 24 hr tablet Take 0.5 tablets (12.5 mg total) by mouth daily.  30 tablet  6  . Multiple Vitamins-Minerals (MULTIVITAL) tablet Take 1 tablet by mouth daily.        . Omega-3 Fatty Acids (FISH OIL) 1000 MG CAPS Take by mouth 2 (two) times daily.        . rosuvastatin (CRESTOR) 40 MG tablet Take 1 tablet (40 mg total) by mouth daily.  30 tablet  6   No current facility-administered medications for this encounter.     PHYSICAL EXAM: Filed Vitals:   12/15/13 1508  BP: 122/82  Pulse: 98  Weight: 195 lb (88.451 kg)  SpO2: 99%   General:  Well appearing. No resp difficulty HEENT:  normal Neck: supple. JVP flat. Carotids 2+ bilaterally; no bruits. No lymphadenopathy or thryomegaly appreciated. Cor: PMI normal. Regular rate & rhythm. No rubs, gallops or murmurs. Lungs: clear Abdomen: soft, nontender, nondistended. No hepatosplenomegaly. No bruits or masses. Good bowel sounds. Extremities: no cyanosis, clubbing, rash, edema Neuro: alert & orientedx3, cranial nerves grossly intact. Moves all 4 extremities w/o difficulty. Affect pleasant.  ECG: NSR 95 LPFB No ST-T wave abnormalities.    ASSESSMENT & PLAN: 1. CAD - currently doing well. Stress test last year looked great. No sx of ischemia. On good regimen. Given premature CAD will consider cardiac CT to re-evaluate degree of CAD and to see if there has been any progression of his disease with particular attention to his LAD lesion  2. HTN - Blood pressure well controlled. Can stop Toprol as it is so low dose.   3. HL - Followed by Dr. Inda Merlin. Continue Crestor. Goal LDL < 70.  Shaune Pascal Bensimhon,MD 3:41 PM

## 2013-12-16 NOTE — Addendum Note (Signed)
Encounter addended by: Asencion Gowda, CCT on: 12/16/2013  9:45 AM<BR>     Documentation filed: Charges VN

## 2013-12-16 NOTE — Addendum Note (Signed)
Encounter addended by: Asencion Gowda, CCT on: 12/16/2013  9:46 AM<BR>     Documentation filed: Charges VN

## 2014-03-30 ENCOUNTER — Telehealth (HOSPITAL_COMMUNITY): Payer: Self-pay | Admitting: Vascular Surgery

## 2014-03-30 NOTE — Telephone Encounter (Signed)
Pt called left message about getting a Cardiac Mri he was told it would be Auth and someone would give him a call to schedule he hasn't heard from anyone.. Please advise

## 2014-03-30 NOTE — Telephone Encounter (Signed)
Cardiac ct is pending, will send to [place] to f/u

## 2014-04-08 NOTE — Telephone Encounter (Signed)
Request denied, will need to speak with provider to see which way he would like to proceed with testing

## 2014-04-13 NOTE — Telephone Encounter (Signed)
Second appeal completed- approval or denial should come within 48 hours  Pt aware of pre cert process and appeals Pt is willing to pay out of pocket if we are unable to get authorization through insurance

## 2014-04-22 NOTE — Telephone Encounter (Signed)
Second appeal denied Speaking with [place], checking the out of pocket cost for pt Cpt code [phone]

## 2014-04-26 NOTE — Telephone Encounter (Signed)
Spoke with pt regarding study (Cardiac CT) Pre cert denied via pts current insurance Pt is willing to pay of pocket Per pre service center, cost of study ~$1111.00   Pt aware and will return call once he is ready to proceed with scheduling

## 2014-08-08 ENCOUNTER — Other Ambulatory Visit (HOSPITAL_COMMUNITY): Payer: Self-pay

## 2014-08-08 DIAGNOSIS — I10 Essential (primary) hypertension: Secondary | ICD-10-CM

## 2014-08-08 MED ORDER — LISINOPRIL 20 MG PO TABS: 20.0000 mg | ORAL_TABLET | Freq: Every day | ORAL | Status: DC

## 2014-08-16 ENCOUNTER — Other Ambulatory Visit (HOSPITAL_COMMUNITY): Payer: Self-pay | Admitting: Cardiology

## 2014-08-16 DIAGNOSIS — I2510 Atherosclerotic heart disease of native coronary artery without angina pectoris: Secondary | ICD-10-CM

## 2014-08-16 MED ORDER — FENOFIBRATE 54 MG PO TABS: ORAL_TABLET | ORAL | Status: DC

## 2014-09-05 ENCOUNTER — Other Ambulatory Visit (HOSPITAL_COMMUNITY): Payer: Self-pay | Admitting: *Deleted

## 2014-09-05 DIAGNOSIS — I2510 Atherosclerotic heart disease of native coronary artery without angina pectoris: Secondary | ICD-10-CM

## 2014-09-05 MED ORDER — FENOFIBRATE 54 MG PO TABS: ORAL_TABLET | ORAL | Status: DC

## 2014-10-05 ENCOUNTER — Other Ambulatory Visit (HOSPITAL_COMMUNITY): Payer: Self-pay | Admitting: Cardiology

## 2014-10-05 DIAGNOSIS — I2510 Atherosclerotic heart disease of native coronary artery without angina pectoris: Secondary | ICD-10-CM

## 2014-10-14 ENCOUNTER — Ambulatory Visit (HOSPITAL_COMMUNITY): Payer: Managed Care, Other (non HMO)

## 2014-10-20 ENCOUNTER — Other Ambulatory Visit (HOSPITAL_COMMUNITY): Payer: Self-pay | Admitting: *Deleted

## 2014-10-21 ENCOUNTER — Ambulatory Visit (HOSPITAL_COMMUNITY)
Admission: RE | Admit: 2014-10-21 | Discharge: 2014-10-21 | Disposition: A | Discharge status: Home / Self Care | Payer: Managed Care, Other (non HMO) | Source: Ambulatory Visit | Attending: Internal Medicine | Admitting: Internal Medicine

## 2014-10-21 ENCOUNTER — Telehealth (HOSPITAL_COMMUNITY): Payer: Self-pay | Admitting: Vascular Surgery

## 2014-10-21 ENCOUNTER — Other Ambulatory Visit (HOSPITAL_COMMUNITY): Payer: Self-pay | Admitting: *Deleted

## 2014-10-21 DIAGNOSIS — R079 Chest pain, unspecified: Secondary | ICD-10-CM | POA: Insufficient documentation

## 2014-10-21 DIAGNOSIS — I2510 Atherosclerotic heart disease of native coronary artery without angina pectoris: Secondary | ICD-10-CM | POA: Diagnosis not present

## 2014-10-21 DIAGNOSIS — I5022 Chronic systolic (congestive) heart failure: Secondary | ICD-10-CM

## 2014-10-21 MED ORDER — IOHEXOL 350 MG/ML SOLN
80.0000 mL | Freq: Once | INTRAVENOUS | Status: AC | PRN
  Administered 2014-10-21: 80 mL via INTRAVENOUS

## 2014-10-21 MED ORDER — NITROGLYCERIN 0.4 MG SL SUBL
SUBLINGUAL_TABLET | SUBLINGUAL | Status: AC
  Administered 2014-10-21: 0.4 mg
  Filled 2014-10-21: qty 1

## 2014-10-21 MED ORDER — ROSUVASTATIN CALCIUM 40 MG PO TABS: 40.0000 mg | ORAL_TABLET | Freq: Every day | ORAL | Status: DC

## 2014-10-21 NOTE — Telephone Encounter (Signed)
crestor sent to pharmacy

## 2014-10-21 NOTE — Telephone Encounter (Signed)
Merrilee Seashore from [facility] called refill request for Crestor 40 mg.. Please advise

## 2014-10-25 ENCOUNTER — Telehealth (HOSPITAL_COMMUNITY): Payer: Self-pay | Admitting: Cardiology

## 2014-10-25 NOTE — Telephone Encounter (Signed)
Pt called to request CT results Advised once results are reviewed by provider, will return call regarding results

## 2014-11-01 NOTE — Telephone Encounter (Signed)
I called and him and gave results.

## 2014-12-21 ENCOUNTER — Other Ambulatory Visit (HOSPITAL_COMMUNITY): Payer: Self-pay | Admitting: Internal Medicine

## 2015-04-09 ENCOUNTER — Other Ambulatory Visit (HOSPITAL_COMMUNITY): Payer: Self-pay | Admitting: Internal Medicine

## 2015-06-29 ENCOUNTER — Other Ambulatory Visit: Payer: Self-pay | Admitting: Internal Medicine

## 2015-09-04 ENCOUNTER — Other Ambulatory Visit (HOSPITAL_COMMUNITY): Payer: Self-pay | Admitting: *Deleted

## 2015-09-04 DIAGNOSIS — I2510 Atherosclerotic heart disease of native coronary artery without angina pectoris: Secondary | ICD-10-CM

## 2015-09-04 MED ORDER — FENOFIBRATE 54 MG PO TABS: ORAL_TABLET | ORAL | Status: DC

## 2015-09-28 ENCOUNTER — Other Ambulatory Visit (HOSPITAL_COMMUNITY): Payer: Self-pay | Admitting: Internal Medicine

## 2015-09-29 ENCOUNTER — Other Ambulatory Visit (HOSPITAL_COMMUNITY): Payer: Self-pay | Admitting: *Deleted

## 2015-09-29 DIAGNOSIS — I5022 Chronic systolic (congestive) heart failure: Secondary | ICD-10-CM

## 2015-09-29 MED ORDER — ROSUVASTATIN CALCIUM 40 MG PO TABS: 40.0000 mg | ORAL_TABLET | Freq: Every day | ORAL | Status: DC

## 2016-02-22 ENCOUNTER — Other Ambulatory Visit (HOSPITAL_COMMUNITY): Payer: Self-pay | Admitting: *Deleted

## 2016-02-22 DIAGNOSIS — I5022 Chronic systolic (congestive) heart failure: Secondary | ICD-10-CM

## 2016-02-22 MED ORDER — LISINOPRIL 20 MG PO TABS: ORAL_TABLET | ORAL | 3 refills | Status: DC

## 2016-02-22 MED ORDER — ROSUVASTATIN CALCIUM 40 MG PO TABS: 40.0000 mg | ORAL_TABLET | Freq: Every day | ORAL | 3 refills | Status: DC

## 2016-02-22 MED ORDER — METOPROLOL SUCCINATE ER 25 MG PO TB24: 12.5000 mg | ORAL_TABLET | Freq: Every day | ORAL | 3 refills | Status: DC

## 2016-03-11 ENCOUNTER — Other Ambulatory Visit (HOSPITAL_COMMUNITY): Payer: Self-pay | Admitting: *Deleted

## 2016-03-11 MED ORDER — LISINOPRIL 20 MG PO TABS: ORAL_TABLET | ORAL | 3 refills | Status: DC

## 2016-04-04 DIAGNOSIS — Z3009 Encounter for other general counseling and advice on contraception: Secondary | ICD-10-CM | POA: Diagnosis not present

## 2016-04-04 DIAGNOSIS — Z6827 Body mass index (BMI) 27.0-27.9, adult: Secondary | ICD-10-CM | POA: Diagnosis not present

## 2016-05-21 DIAGNOSIS — S63630A Sprain of interphalangeal joint of right index finger, initial encounter: Secondary | ICD-10-CM | POA: Diagnosis not present

## 2016-05-28 DIAGNOSIS — Z3009 Encounter for other general counseling and advice on contraception: Secondary | ICD-10-CM | POA: Diagnosis not present

## 2016-07-26 DIAGNOSIS — Z302 Encounter for sterilization: Secondary | ICD-10-CM | POA: Diagnosis not present

## 2016-12-02 DIAGNOSIS — M7021 Olecranon bursitis, right elbow: Secondary | ICD-10-CM | POA: Diagnosis not present

## 2017-03-05 ENCOUNTER — Other Ambulatory Visit (HOSPITAL_COMMUNITY): Payer: Self-pay | Admitting: Internal Medicine

## 2017-03-05 DIAGNOSIS — I5022 Chronic systolic (congestive) heart failure: Secondary | ICD-10-CM

## 2017-04-03 DIAGNOSIS — M7021 Olecranon bursitis, right elbow: Secondary | ICD-10-CM | POA: Diagnosis not present

## 2017-04-03 DIAGNOSIS — S46012A Strain of muscle(s) and tendon(s) of the rotator cuff of left shoulder, initial encounter: Secondary | ICD-10-CM | POA: Diagnosis not present

## 2017-07-28 DIAGNOSIS — M25512 Pain in left shoulder: Secondary | ICD-10-CM | POA: Diagnosis not present

## 2017-07-31 DIAGNOSIS — M25512 Pain in left shoulder: Secondary | ICD-10-CM | POA: Diagnosis not present

## 2017-08-04 DIAGNOSIS — S46012A Strain of muscle(s) and tendon(s) of the rotator cuff of left shoulder, initial encounter: Secondary | ICD-10-CM | POA: Diagnosis not present

## 2017-08-08 DIAGNOSIS — M25512 Pain in left shoulder: Secondary | ICD-10-CM | POA: Diagnosis not present

## 2017-08-11 DIAGNOSIS — M25512 Pain in left shoulder: Secondary | ICD-10-CM | POA: Diagnosis not present

## 2017-08-15 DIAGNOSIS — M25512 Pain in left shoulder: Secondary | ICD-10-CM | POA: Diagnosis not present

## 2017-08-18 DIAGNOSIS — M25512 Pain in left shoulder: Secondary | ICD-10-CM | POA: Diagnosis not present

## 2017-08-25 DIAGNOSIS — M25512 Pain in left shoulder: Secondary | ICD-10-CM | POA: Diagnosis not present

## 2017-08-28 DIAGNOSIS — M25512 Pain in left shoulder: Secondary | ICD-10-CM | POA: Diagnosis not present

## 2017-09-01 DIAGNOSIS — M25512 Pain in left shoulder: Secondary | ICD-10-CM | POA: Diagnosis not present

## 2017-09-04 DIAGNOSIS — M25512 Pain in left shoulder: Secondary | ICD-10-CM | POA: Diagnosis not present

## 2017-09-15 DIAGNOSIS — S46012D Strain of muscle(s) and tendon(s) of the rotator cuff of left shoulder, subsequent encounter: Secondary | ICD-10-CM | POA: Diagnosis not present

## 2017-10-08 DIAGNOSIS — E559 Vitamin D deficiency, unspecified: Secondary | ICD-10-CM | POA: Diagnosis not present

## 2017-10-08 DIAGNOSIS — E785 Hyperlipidemia, unspecified: Secondary | ICD-10-CM | POA: Diagnosis not present

## 2017-10-08 DIAGNOSIS — Z0000 Encounter for general adult medical examination without abnormal findings: Secondary | ICD-10-CM | POA: Diagnosis not present

## 2017-10-08 DIAGNOSIS — Z79899 Other long term (current) drug therapy: Secondary | ICD-10-CM | POA: Diagnosis not present

## 2017-10-08 DIAGNOSIS — I10 Essential (primary) hypertension: Secondary | ICD-10-CM | POA: Diagnosis not present

## 2017-10-08 DIAGNOSIS — K625 Hemorrhage of anus and rectum: Secondary | ICD-10-CM | POA: Diagnosis not present

## 2017-10-08 DIAGNOSIS — I2510 Atherosclerotic heart disease of native coronary artery without angina pectoris: Secondary | ICD-10-CM | POA: Diagnosis not present

## 2017-10-13 ENCOUNTER — Other Ambulatory Visit (HOSPITAL_COMMUNITY): Payer: Self-pay | Admitting: *Deleted

## 2017-10-13 DIAGNOSIS — I2510 Atherosclerotic heart disease of native coronary artery without angina pectoris: Secondary | ICD-10-CM

## 2017-10-13 DIAGNOSIS — I10 Essential (primary) hypertension: Secondary | ICD-10-CM

## 2017-11-26 NOTE — Progress Notes (Signed)
Cardiology Office Note  Date:  11/27/2017   ID:  Lance, Terry 11/07/1975, MRN [id number]  PCP:  Lance Huddle, MD   Chief Complaint  Patient presents with  . New Patient (Initial Visit)    Patient denies chest pain and SOB. Meds reviewed verbally with patient.     HPI:  Lance Terry is a 42 y/o male with h/o  HTN,  HL,  CAD with NSTEMI 7/10  in setting of heat stroke  Cath from 7/10 mid LAD lesion  50-60% Who presents by referral from Dr. Josetta Terry for consultation of his   coronary artery disease and history of non-STEMI  Previous events discussed with him leading to his non-STEMI 2010 Severe heatstroke requiring several days in hospital and IV fluids In that setting he had elevated troponin, cardiac catheterization work-up showing 50% mid LAD lesion Stress testing later in 2010 02/2009 ETT/Myoview. He exercised for 12:00 on Bruce protocol. EF 71%. No ischemia. 10/2012: ETT He exercised for 11:01 on Bruce protocol. Peak HR 181 No ischemia.  Cardiac CTA showing mild proximal LAD disease and 50% mid LAD disease 2016 Images pulled up and discussed with him in detail No work-up since that time  Previous lab work reviewed 05/2102: TC 174, Trig 166, HLD 43, and LDL 109 05/2012 K 4.5, Cr 0.98 Most recent lab work cholesterol still not at goal  Active but no regular exercise program for weight loss 42 yr old and 41 yr old.   With activity denies any shortness of breath or chest pain  EKG personally reviewed by myself on todays visit Shows normal sinus rhythm rate 73 bpm no significant ST or T wave changes   PMH:   has a past medical history of Coronary artery disease, Hyperlipidemia, and Hypertension.  PSH:    Past Surgical History:  Procedure Laterality Date  . SHOULDER SURGERY  2003    Current Outpatient Medications  Medication Sig Dispense Refill  . aspirin 81 MG tablet Take 81 mg by mouth daily.    . fenofibrate 54 MG tablet TAKE 1 TABLET BY MOUTH DAILY 30  tablet 6  . lisinopril (PRINIVIL,ZESTRIL) 20 MG tablet TAKE 1 TABLET (20 MG TOTAL) BY MOUTH DAILY. 90 tablet 3  . metoprolol succinate (TOPROL-XL) 25 MG 24 hr tablet TAKE 1/2 TABLET BY MOUTH EVERY DAY 30 tablet 5  . Multiple Vitamins-Minerals (MULTIVITAL) tablet Take 1 tablet by mouth daily.      . Omega-3 Fatty Acids (FISH OIL) 1000 MG CAPS Take by mouth 2 (two) times daily.      . rosuvastatin (CRESTOR) 40 MG tablet Take 1 tablet (40 mg total) by mouth daily. 90 tablet 3   No current facility-administered medications for this visit.      Allergies:   Ibuprofen and Penicillins   Social History:  The patient  reports that he has quit smoking. He has never used smokeless tobacco. He reports that he drinks alcohol. He reports that he does not use drugs.   Family History:   family history includes Coronary artery disease in his father, other, and paternal grandfather; Heart attack in his paternal grandfather; Heart attack (age of onset: 63) in his father.    Review of Systems: Review of Systems  Constitutional: Negative.   Respiratory: Negative.   Cardiovascular: Negative.   Gastrointestinal: Negative.   Musculoskeletal: Negative.   Neurological: Negative.   Psychiatric/Behavioral: Negative.   All other systems reviewed and are negative.    PHYSICAL EXAM: VS:  BP (!) 138/92 (BP Location: Right Arm, Patient Position: Sitting, Cuff Size: Normal)   Pulse 73   Ht 5' 10.5" (1.791 m)   Wt 210 lb (95.3 kg)   BMI 29.71 kg/m  , BMI Body mass index is 29.71 kg/m. GEN: Well nourished, well developed, in no acute distress  HEENT: normal  Neck: no JVD, carotid bruits, or masses Cardiac: RRR; no murmurs, rubs, or gallops,no edema  Respiratory:  clear to auscultation bilaterally, normal work of breathing GI: soft, nontender, nondistended, + BS MS: no deformity or atrophy  Skin: warm and dry, no rash Neuro:  Strength and sensation are intact Psych: euthymic mood, full  affect    Recent Labs: No results found for requested labs within last 8760 hours.    Lipid Panel No results found for: CHOL, HDL, LDLCALC, TRIG    Wt Readings from Last 3 Encounters:  11/27/17 210 lb (95.3 kg)  12/15/13 195 lb (88.5 kg)  10/26/12 208 lb 1.6 oz (94.4 kg)       ASSESSMENT AND PLAN:  Atherosclerosis of native coronary artery of native heart with stable angina pectoris ([place]) - Plan: EKG 12-Lead Denies any anginal symptoms at this time Recommended lifestyle modification and weight loss No ischemic work-up at this time Long discussion concerning prior cardiac catheterization results stress test results in 2013 and cardiac CTA results 2016  Mixed hyperlipidemia Recommend he continue his Crestor and we will add Zetia 10 mg daily to achieve LDL less than 70 Also discussed other treatment options, PSCK inhibitors and other pending trials with new agents coming out in the next 2 years  Essential hypertension - Plan: EKG 12-Lead Recommend he monitor blood pressure at home, recommended weight loss for diastolic pressure of 90  Disposition:   F/U  12 months  Patient seen in consultation for Dr. Josetta Terry and will be referred back to his office for ongoing care of the issues detailed above   Total encounter time more than 60 minutes  Greater than 50% was spent in counseling and coordination of care with the patient   Orders Placed This Encounter  Procedures  . EKG 12-Lead     Signed, Esmond Plants, M.D., Ph.D. 11/27/2017  [facility] [place], [place]

## 2017-11-27 ENCOUNTER — Encounter: Payer: Self-pay | Admitting: Cardiovascular Disease

## 2017-11-27 ENCOUNTER — Ambulatory Visit (INDEPENDENT_AMBULATORY_CARE_PROVIDER_SITE_OTHER): Payer: BLUE CROSS/BLUE SHIELD | Admitting: Cardiovascular Disease

## 2017-11-27 VITALS — BP 138/92 | HR 73 | Ht 70.5 in | Wt 210.0 lb

## 2017-11-27 DIAGNOSIS — E782 Mixed hyperlipidemia: Secondary | ICD-10-CM

## 2017-11-27 DIAGNOSIS — I10 Essential (primary) hypertension: Secondary | ICD-10-CM | POA: Diagnosis not present

## 2017-11-27 DIAGNOSIS — I25118 Atherosclerotic heart disease of native coronary artery with other forms of angina pectoris: Secondary | ICD-10-CM

## 2017-11-27 MED ORDER — EZETIMIBE 10 MG PO TABS: 10.0000 mg | ORAL_TABLET | Freq: Every day | ORAL | 11 refills | Status: DC

## 2017-11-27 NOTE — Patient Instructions (Addendum)
Medication Instructions:   Please start zetia one a day Stay on crestor  Labwork:  No new labs needed  Testing/Procedures:  No further testing at this time   Follow-Up: It was a pleasure seeing you in the office today. Please call us if you have new issues that need to be addressed before your next appt.  [phone]  Your physician wants you to follow-up in: 12 months.  You will receive a reminder letter in the mail two months in advance. If you don't receive a letter, please call our office to schedule the follow-up appointment.  If you need a refill on your cardiac medications before your next appointment, please call your pharmacy.  For educational health videos Log in to : [email or web address] Or : [email or web address], password : [email or web address]

## 2017-12-22 ENCOUNTER — Telehealth: Payer: Self-pay | Admitting: Cardiovascular Disease

## 2017-12-22 NOTE — Telephone Encounter (Signed)
Received [employer] request for medical records.  Sent to [employer] for processing.

## 2018-01-01 NOTE — Telephone Encounter (Signed)
Received second request sent to [employer]

## 2018-02-02 DIAGNOSIS — G8929 Other chronic pain: Secondary | ICD-10-CM | POA: Diagnosis not present

## 2018-02-02 DIAGNOSIS — M25512 Pain in left shoulder: Secondary | ICD-10-CM | POA: Diagnosis not present

## 2019-01-15 ENCOUNTER — Other Ambulatory Visit: Payer: Self-pay | Admitting: *Deleted

## 2019-01-15 MED ORDER — EZETIMIBE 10 MG PO TABS: 10.0000 mg | ORAL_TABLET | Freq: Every day | ORAL | 1 refills | Status: DC

## 2019-02-02 DIAGNOSIS — Z1283 Encounter for screening for malignant neoplasm of skin: Secondary | ICD-10-CM | POA: Diagnosis not present

## 2019-02-02 DIAGNOSIS — L821 Other seborrheic keratosis: Secondary | ICD-10-CM | POA: Diagnosis not present

## 2019-02-02 DIAGNOSIS — L814 Other melanin hyperpigmentation: Secondary | ICD-10-CM | POA: Diagnosis not present

## 2019-02-02 DIAGNOSIS — D225 Melanocytic nevi of trunk: Secondary | ICD-10-CM | POA: Diagnosis not present

## 2019-02-12 ENCOUNTER — Other Ambulatory Visit: Payer: Self-pay | Admitting: Cardiovascular Disease

## 2019-02-15 ENCOUNTER — Telehealth: Payer: Self-pay | Admitting: Cardiovascular Disease

## 2019-02-15 NOTE — Progress Notes (Signed)
Cardiology Office Note  Date:  02/16/2019   ID:  Lance, Terry Oct 24, 1975, MRN [id number]  PCP:  Lance Huddle, MD   Chief Complaint  Patient presents with  . Other    12 month follow up. Meds reviewed verbally with patient.     HPI:  Lance Terry is a 43 y/o male with h/o  HTN,  HL,  CAD with NSTEMI 01/2009  in setting of heat stroke  Cath from 7/10 mid LAD lesion  50-60% Who presents for f/u of his coronary artery disease ,  history of non-STEMI  Weight up, doing less exercise Was previously doing CrossFit and this was stopped secondary to COVID Otherwise active Rare dizziness Dizziness on golf coarse, possibly dehydration  No anginal sx No shortness of breath on exertion, no tightness in the chest  No recent lab work Previous lab work March 2019 LDL above goal  EKG personally reviewed by myself on todays visit Shows normal sinus rhythm with rate 85 bpm, unable to exclude old inferior MI, no change from prior EKG  Previous records reviewed  non-STEMI 2010 Severe heatstroke requiring several days in hospital and IV fluids In that setting he had elevated troponin, cardiac catheterization work-up showing 50% mid LAD lesion Stress testing later in 2010 02/2009 ETT/Myoview. He exercised for 12:00 on Bruce protocol. EF 71%. No ischemia. 10/2012: ETT He exercised for 11:01 on Bruce protocol. Peak HR 181 No ischemia.  Cardiac CTA showing mild proximal LAD disease and 50% mid LAD disease 2016  2 children go to [facility]  PMH:   has a past medical history of Coronary artery disease, Hyperlipidemia, and Hypertension.  PSH:    Past Surgical History:  Procedure Laterality Date  . SHOULDER SURGERY  2003    Current Outpatient Medications  Medication Sig Dispense Refill  . aspirin 81 MG tablet Take 81 mg by mouth daily.    Marland Kitchen ezetimibe (ZETIA) 10 MG tablet TAKE 1 TABLET BY MOUTH EVERY DAY 30 tablet 0  . lisinopril (PRINIVIL,ZESTRIL) 20 MG tablet TAKE 1 TABLET (20 MG TOTAL) BY  MOUTH DAILY. 90 tablet 3  . metoprolol succinate (TOPROL-XL) 25 MG 24 hr tablet TAKE 1/2 TABLET BY MOUTH EVERY DAY 30 tablet 5  . Multiple Vitamins-Minerals (MULTIVITAL) tablet Take 1 tablet by mouth daily.      . rosuvastatin (CRESTOR) 40 MG tablet Take 1 tablet (40 mg total) by mouth daily. 90 tablet 3  . Omega-3 Fatty Acids (FISH OIL) 1000 MG CAPS Take by mouth 2 (two) times daily.       No current facility-administered medications for this visit.     Allergies:   Ibuprofen and Penicillins   Social History:  The patient  reports that he has quit smoking. He has never used smokeless tobacco. He reports current alcohol use. He reports that he does not use drugs.   Family History:   family history includes Coronary artery disease in his father, paternal grandfather, and another family member; Heart attack in his paternal grandfather; Heart attack (age of onset: 51) in his father.    Review of Systems: Review of Systems  Constitutional: Negative.   HENT: Negative.   Respiratory: Negative.   Cardiovascular: Negative.   Gastrointestinal: Negative.   Musculoskeletal: Negative.   Neurological: Negative.   Psychiatric/Behavioral: Negative.   All other systems reviewed and are negative.   PHYSICAL EXAM: VS:  BP 130/82 (BP Location: Left Arm, Patient Position: Sitting, Cuff Size: Normal)   Pulse 85  Ht 5' 10"  (1.778 m)   Wt 208 lb (94.3 kg)   BMI 29.84 kg/m  , BMI Body mass index is 29.84 kg/m. Constitutional:  oriented to person, place, and time. No distress.  HENT:  Head: Grossly normal Eyes:  no discharge. No scleral icterus.  Neck: No JVD, no carotid bruits  Cardiovascular: Regular rate and rhythm, no murmurs appreciated Pulmonary/Chest: Clear to auscultation bilaterally, no wheezes or rails Abdominal: Soft.  no distension.  no tenderness.  Musculoskeletal: Normal range of motion Neurological:  normal muscle tone. Coordination normal. No atrophy Skin: Skin warm and  dry Psychiatric: normal affect, pleasant   Recent Labs: No results found for requested labs within last 8760 hours.    Lipid Panel No results found for: CHOL, HDL, LDLCALC, TRIG    Wt Readings from Last 3 Encounters:  02/16/19 208 lb (94.3 kg)  11/27/17 210 lb (95.3 kg)  12/15/13 195 lb (88.5 kg)       ASSESSMENT AND PLAN:  Atherosclerosis of native coronary artery of native heart with stable angina pectoris ([place]) - Plan: EKG 12-Lead prior cardiac catheterization results stress test results in 2013 and cardiac CTA results 2016 --New lab work today, will push for LDL less than 70 May need a PCSK9 inhibitor  Mixed hyperlipidemia  continue his Crestor and we will add Zetia 10 mg daily to achieve LDL less than 70 Long discussion concerning PSCK inhibitors  Lab work pending  Essential hypertension - Plan: EKG 12-Lead Blood pressure is well controlled on today's visit. No changes made to the medications.   Disposition:   F/U  12 months Demonstration done on the PCSK9 inhibitor, injectable pen Discussed mechanism and lipid goals Labs ordered  Total encounter time more than 25 minutes  Greater than 50% was spent in counseling and coordination of care with the patient    Orders Placed This Encounter  Procedures  . EKG 12-Lead     Signed, Esmond Plants, M.D., Ph.D. 02/16/2019  [place], [place]

## 2019-02-15 NOTE — Telephone Encounter (Signed)

## 2019-02-16 ENCOUNTER — Ambulatory Visit (INDEPENDENT_AMBULATORY_CARE_PROVIDER_SITE_OTHER): Payer: BC Managed Care – PPO | Admitting: Cardiovascular Disease

## 2019-02-16 ENCOUNTER — Other Ambulatory Visit: Payer: Self-pay

## 2019-02-16 ENCOUNTER — Encounter: Payer: Self-pay | Admitting: Cardiovascular Disease

## 2019-02-16 VITALS — BP 130/82 | HR 85 | Ht 70.0 in | Wt 208.0 lb

## 2019-02-16 DIAGNOSIS — E782 Mixed hyperlipidemia: Secondary | ICD-10-CM

## 2019-02-16 DIAGNOSIS — I25118 Atherosclerotic heart disease of native coronary artery with other forms of angina pectoris: Secondary | ICD-10-CM | POA: Diagnosis not present

## 2019-02-16 DIAGNOSIS — I10 Essential (primary) hypertension: Secondary | ICD-10-CM

## 2019-02-16 NOTE — Progress Notes (Signed)
Education on Repatha/Praluent pen reviewed with patient and verbalized understanding of steps to administer and sites. No further questions and we will wait for his labs.

## 2019-02-16 NOTE — Patient Instructions (Addendum)
Read about repatha/praluent  Medication Instructions:  No changes  If you need a refill on your cardiac medications before your next appointment, please call your pharmacy.    Lab work:  We will check labs today: Lipids, CMP   If you have labs (blood work) drawn today and your tests are completely normal, you will receive your results only by: Marland Kitchen MyChart Message (if you have MyChart) OR . A paper copy in the mail If you have any lab test that is abnormal or we need to change your treatment, we will call you to review the results.   Testing/Procedures: No new testing needed   Follow-Up: At [facility], you and your health needs are our priority.  As part of our continuing mission to provide you with exceptional heart care, we have created designated Provider Care Teams.  These Care Teams include your primary Cardiologist (physician) and Advanced Practice Providers (APPs -  Physician Assistants and Nurse Practitioners) who all work together to provide you with the care you need, when you need it.  . You will need a follow up appointment in 12 months .   Please call our office 2 months in advance to schedule this appointment.    . Providers on your designated Care Team:   . Murray Hodgkins, NP . Christell Faith, PA-C . Marrianne Mood, PA-C  Any Other Special Instructions Will Be Listed Below (If Applicable).  For educational health videos Log in to : [email or web address] Or : [email or web address], password : [email or web address]

## 2019-02-17 LAB — COMPREHENSIVE METABOLIC PANEL
ALT: 115 IU/L — ABNORMAL HIGH (ref 0–44)
AST: 68 IU/L — ABNORMAL HIGH (ref 0–40)
Albumin/Globulin Ratio: 2.2 (ref 1.2–2.2)
Albumin: 5.4 g/dL — ABNORMAL HIGH (ref 4.0–5.0)
Alkaline Phosphatase: 80 IU/L (ref 39–117)
BUN/Creatinine Ratio: 17 (ref 9–20)
BUN: 14 mg/dL (ref 6–24)
Bilirubin Total: 1 mg/dL (ref 0.0–1.2)
CO2: 17 mmol/L — ABNORMAL LOW (ref 20–29)
Calcium: 10.1 mg/dL (ref 8.7–10.2)
Chloride: 102 mmol/L (ref 96–106)
Creatinine, Ser: 0.82 mg/dL (ref 0.76–1.27)
GFR calc Af Amer: 125 mL/min/{1.73_m2} (ref >59)
GFR calc non Af Amer: 108 mL/min/{1.73_m2} (ref >59)
Globulin, Total: 2.5 g/dL (ref 1.5–4.5)
Glucose: 96 mg/dL (ref 65–99)
Potassium: 4.4 mmol/L (ref 3.5–5.2)
Sodium: 139 mmol/L (ref 134–144)
Total Protein: 7.9 g/dL (ref 6.0–8.5)

## 2019-02-17 LAB — LIPID PANEL
Chol/HDL Ratio: 4.2 ratio (ref 0.0–5.0)
Cholesterol, Total: 150 mg/dL (ref 100–199)
HDL: 36 mg/dL — ABNORMAL LOW (ref >39)
LDL Calculated: 44 mg/dL (ref 0–99)
Triglycerides: 349 mg/dL — ABNORMAL HIGH (ref 0–149)
VLDL Cholesterol Cal: 70 mg/dL — ABNORMAL HIGH (ref 5–40)

## 2019-02-19 ENCOUNTER — Telehealth: Payer: Self-pay

## 2019-02-19 DIAGNOSIS — I5022 Chronic systolic (congestive) heart failure: Secondary | ICD-10-CM

## 2019-02-19 DIAGNOSIS — R7989 Other specified abnormal findings of blood chemistry: Secondary | ICD-10-CM

## 2019-02-19 MED ORDER — ROSUVASTATIN CALCIUM 20 MG PO TABS: 20.0000 mg | ORAL_TABLET | Freq: Every day | ORAL | 1 refills | Status: DC

## 2019-02-19 NOTE — Telephone Encounter (Signed)
Called to give pt lab results and Dr.Gollan's recommendation. [email or web address].

## 2019-02-19 NOTE — Telephone Encounter (Signed)
-----   Message from Minna Merritts, MD sent at 02/18/2019 10:38 PM EDT ----- Liver numbers up Could be from fatty liver /weight gain Or from cretsor Would cut crestor to 20 mg daily Work on restarting cross fit/get weight down Recheck lfts in 2 months numbers were somewhat elevated in 2015 We do not have all labs from 2019 from PMD  LDL cholesterol is verylow, 44 which is where we want it, Will not qualify with such a good number for PCSK9 inh (repatha etc)

## 2019-02-19 NOTE — Telephone Encounter (Signed)
Pt made aware of lab results and Dr.Gollan's recommendation with verbalized understanding. Rx sent to the pt pharmacy for Crestor 37m daily. Order in [place] 2 month repeat LFT. [facility].

## 2019-02-26 DIAGNOSIS — S46112A Strain of muscle, fascia and tendon of long head of biceps, left arm, initial encounter: Secondary | ICD-10-CM | POA: Diagnosis not present

## 2019-02-26 DIAGNOSIS — Z6826 Body mass index (BMI) 26.0-26.9, adult: Secondary | ICD-10-CM | POA: Diagnosis not present

## 2019-03-05 DIAGNOSIS — I252 Old myocardial infarction: Secondary | ICD-10-CM | POA: Diagnosis not present

## 2019-03-05 DIAGNOSIS — I10 Essential (primary) hypertension: Secondary | ICD-10-CM | POA: Diagnosis not present

## 2019-03-05 DIAGNOSIS — I2510 Atherosclerotic heart disease of native coronary artery without angina pectoris: Secondary | ICD-10-CM | POA: Diagnosis not present

## 2019-03-05 DIAGNOSIS — S46112A Strain of muscle, fascia and tendon of long head of biceps, left arm, initial encounter: Secondary | ICD-10-CM | POA: Diagnosis not present

## 2019-03-09 ENCOUNTER — Other Ambulatory Visit: Payer: Self-pay | Admitting: Cardiovascular Disease

## 2019-03-26 ENCOUNTER — Other Ambulatory Visit: Payer: Self-pay | Admitting: *Deleted

## 2019-03-26 DIAGNOSIS — R6889 Other general symptoms and signs: Secondary | ICD-10-CM | POA: Diagnosis not present

## 2019-03-26 DIAGNOSIS — Z20822 Contact with and (suspected) exposure to covid-19: Secondary | ICD-10-CM

## 2019-03-28 LAB — NOVEL CORONAVIRUS, NAA: SARS-CoV-2, NAA: NOT DETECTED

## 2019-05-10 ENCOUNTER — Other Ambulatory Visit: Payer: Self-pay

## 2019-05-10 DIAGNOSIS — Z20828 Contact with and (suspected) exposure to other viral communicable diseases: Secondary | ICD-10-CM | POA: Diagnosis not present

## 2019-05-10 DIAGNOSIS — Z20822 Contact with and (suspected) exposure to covid-19: Secondary | ICD-10-CM

## 2019-05-12 ENCOUNTER — Other Ambulatory Visit: Payer: Self-pay

## 2019-05-12 DIAGNOSIS — Z20822 Contact with and (suspected) exposure to covid-19: Secondary | ICD-10-CM

## 2019-05-12 DIAGNOSIS — Z20828 Contact with and (suspected) exposure to other viral communicable diseases: Secondary | ICD-10-CM | POA: Diagnosis not present

## 2019-05-12 LAB — NOVEL CORONAVIRUS, NAA: SARS-CoV-2, NAA: NOT DETECTED

## 2019-05-13 LAB — NOVEL CORONAVIRUS, NAA: SARS-CoV-2, NAA: NOT DETECTED

## 2019-08-31 ENCOUNTER — Ambulatory Visit: Payer: BC Managed Care – PPO | Attending: Internal Medicine

## 2019-08-31 DIAGNOSIS — Z20822 Contact with and (suspected) exposure to covid-19: Secondary | ICD-10-CM

## 2019-09-01 LAB — NOVEL CORONAVIRUS, NAA: SARS-CoV-2, NAA: NOT DETECTED

## 2019-09-02 ENCOUNTER — Other Ambulatory Visit: Payer: BC Managed Care – PPO

## 2019-11-26 DIAGNOSIS — S61213A Laceration without foreign body of left middle finger without damage to nail, initial encounter: Secondary | ICD-10-CM | POA: Diagnosis not present

## 2020-02-10 DIAGNOSIS — M9903 Segmental and somatic dysfunction of lumbar region: Secondary | ICD-10-CM | POA: Diagnosis not present

## 2020-02-10 DIAGNOSIS — M62412 Contracture of muscle, left shoulder: Secondary | ICD-10-CM | POA: Diagnosis not present

## 2020-02-10 DIAGNOSIS — M9902 Segmental and somatic dysfunction of thoracic region: Secondary | ICD-10-CM | POA: Diagnosis not present

## 2020-02-10 DIAGNOSIS — M9907 Segmental and somatic dysfunction of upper extremity: Secondary | ICD-10-CM | POA: Diagnosis not present

## 2020-02-10 DIAGNOSIS — M62830 Muscle spasm of back: Secondary | ICD-10-CM | POA: Diagnosis not present

## 2020-02-10 DIAGNOSIS — S39012A Strain of muscle, fascia and tendon of lower back, initial encounter: Secondary | ICD-10-CM | POA: Diagnosis not present

## 2020-02-11 DIAGNOSIS — M9903 Segmental and somatic dysfunction of lumbar region: Secondary | ICD-10-CM | POA: Diagnosis not present

## 2020-02-11 DIAGNOSIS — S39012A Strain of muscle, fascia and tendon of lower back, initial encounter: Secondary | ICD-10-CM | POA: Diagnosis not present

## 2020-02-11 DIAGNOSIS — M9902 Segmental and somatic dysfunction of thoracic region: Secondary | ICD-10-CM | POA: Diagnosis not present

## 2020-02-11 DIAGNOSIS — M62830 Muscle spasm of back: Secondary | ICD-10-CM | POA: Diagnosis not present

## 2020-02-11 DIAGNOSIS — M62412 Contracture of muscle, left shoulder: Secondary | ICD-10-CM | POA: Diagnosis not present

## 2020-02-11 DIAGNOSIS — M9907 Segmental and somatic dysfunction of upper extremity: Secondary | ICD-10-CM | POA: Diagnosis not present

## 2020-02-15 DIAGNOSIS — M9903 Segmental and somatic dysfunction of lumbar region: Secondary | ICD-10-CM | POA: Diagnosis not present

## 2020-02-15 DIAGNOSIS — M62412 Contracture of muscle, left shoulder: Secondary | ICD-10-CM | POA: Diagnosis not present

## 2020-02-15 DIAGNOSIS — M62830 Muscle spasm of back: Secondary | ICD-10-CM | POA: Diagnosis not present

## 2020-02-15 DIAGNOSIS — M9907 Segmental and somatic dysfunction of upper extremity: Secondary | ICD-10-CM | POA: Diagnosis not present

## 2020-02-15 DIAGNOSIS — S39012A Strain of muscle, fascia and tendon of lower back, initial encounter: Secondary | ICD-10-CM | POA: Diagnosis not present

## 2020-02-15 DIAGNOSIS — M9902 Segmental and somatic dysfunction of thoracic region: Secondary | ICD-10-CM | POA: Diagnosis not present

## 2020-02-16 DIAGNOSIS — M62830 Muscle spasm of back: Secondary | ICD-10-CM | POA: Diagnosis not present

## 2020-02-16 DIAGNOSIS — M62412 Contracture of muscle, left shoulder: Secondary | ICD-10-CM | POA: Diagnosis not present

## 2020-02-16 DIAGNOSIS — M9902 Segmental and somatic dysfunction of thoracic region: Secondary | ICD-10-CM | POA: Diagnosis not present

## 2020-02-16 DIAGNOSIS — S39012A Strain of muscle, fascia and tendon of lower back, initial encounter: Secondary | ICD-10-CM | POA: Diagnosis not present

## 2020-02-16 DIAGNOSIS — M9903 Segmental and somatic dysfunction of lumbar region: Secondary | ICD-10-CM | POA: Diagnosis not present

## 2020-02-16 DIAGNOSIS — M9907 Segmental and somatic dysfunction of upper extremity: Secondary | ICD-10-CM | POA: Diagnosis not present

## 2020-02-26 NOTE — Progress Notes (Signed)
Cardiology Office Note  Date:  02/28/2020   ID:  Lance Terry 1976-01-24, MRN [id number]  PCP:  Josetta Huddle, MD   Chief Complaint  Patient presents with  . Other    12 month follow up. Meds reviewed verbally with patient.     HPI:  Lance Terry is a 44 y/o male with h/o  HTN,  HL,  CAD with NSTEMI 01/2009  in setting of heat stroke  Cath from 7/10 mid LAD lesion  50-60% Who presents for f/u of his coronary artery disease ,  history of non-STEMI  Last seen in clinic July 2020 At that time, was doing less exercise, weight was elevated Had episode of [place]iness [place], possible dehydration Denied any chest pain concerning for angina No shortness of breath on exertion  Lab work reviewed July 2020 total cholesterol 150 LDL 44  Off pills for a few months, wanted to see how he would feel and how his blood pressure responded Now back on meds 3 weeks including cholesterol medications and blood pressure pills  Starting to run more[place] Denies any chest pain or shortness of breath on exertion On exertion heart rate up to 160 , no symptoms  EKG personally reviewed by myself on todays visit Shows normal sinus rhythm rate 71 bpm no significant ST-T wave changes  Previous records reviewed  non-STEMI 2010 Severe heatstroke requiring several days in hospital and IV fluids In that setting he had elevated troponin, cardiac catheterization work-up showing 50% mid LAD lesion Stress testing later in 2010 02/2009 ETT/Myoview. He exercised for 12:00 on Bruce protocol. EF 71%. No ischemia. 10/2012: ETT He exercised for 11:01 on Bruce protocol. Peak HR 181 No ischemia.  Cardiac CTA showing mild proximal LAD disease and 50% mid LAD disease 2016  2 children go to [facility]  PMH:   has a past medical history of Coronary artery disease, Hyperlipidemia, and Hypertension.  PSH:    Past Surgical History:  Procedure Laterality Date  .  SHOULDER SURGERY  2003    Current Outpatient Medications  Medication Sig Dispense Refill  . aspirin 81 MG tablet Take 81 mg by mouth daily.    Marland Kitchen ezetimibe (ZETIA) 10 MG tablet Take 1 tablet (10 mg total) by mouth daily. 90 tablet 3  . lisinopril (PRINIVIL,ZESTRIL) 20 MG tablet TAKE 1 TABLET (20 MG TOTAL) BY MOUTH DAILY. 90 tablet 3  . metoprolol succinate (TOPROL-XL) 25 MG 24 hr tablet TAKE 1/2 TABLET BY MOUTH EVERY DAY 30 tablet 5  . Multiple Vitamins-Minerals (MULTIVITAL) tablet Take 1 tablet by mouth daily.      . Omega-3 Fatty Acids (FISH OIL) 1000 MG CAPS Take by mouth 2 (two) times daily.      . rosuvastatin (CRESTOR) 20 MG tablet Take 1 tablet (20 mg total) by mouth daily. 90 tablet 3   No current facility-administered medications for this visit.    Allergies:   Ibuprofen and Penicillins   Social History:  The patient  reports that he has quit smoking. He has never used smokeless tobacco. He reports current alcohol use. He reports that he does not use drugs.   Family History:   family history includes Coronary artery disease in his father, paternal grandfather, and another family member; Heart attack in his paternal grandfather; Heart attack (age of onset: 15) in his father.    Review of Systems: Review of Systems  Constitutional: Negative.   HENT:  Negative.   Respiratory: Negative.   Cardiovascular: Negative.   Gastrointestinal: Negative.   Musculoskeletal: Negative.   Neurological: Negative.   Psychiatric/Behavioral: Negative.   All other systems reviewed and are negative.   PHYSICAL EXAM: VS:  BP (!) 144/76 (BP Location: Left Arm, Patient Position: Sitting, Cuff Size: Normal)   Pulse 71   Ht _0  (1.778 m)   Wt 208 lb (94.3 kg)   BMI 29.84 kg/m  , BMI Body mass index is 29.84 kg/m. Constitutional:  oriented to person, place, and time. No distress.  HENT:  Head: Grossly normal Eyes:  no discharge. No scleral icterus.  Neck: No JVD, no carotid bruits   Cardiovascular: Regular rate and rhythm, no murmurs appreciated Pulmonary/Chest: Clear to auscultation bilaterally, no wheezes or rails Abdominal: Soft.  no distension.  no tenderness.  Musculoskeletal: Normal range of motion Neurological:  normal muscle tone. Coordination normal. No atrophy Skin: Skin warm and dry Psychiatric: normal affect, pleasant   Recent Labs: No results found for requested labs within last 8760 hours.    Lipid Panel Lab Results  Component Value Date   CHOL 150 02/16/2019   HDL 36 (L) 02/16/2019   LDLCALC 44 02/16/2019   TRIG 349 (H) 02/16/2019      Wt Readings from Last 3 Encounters:  02/28/20 208 lb (94.3 kg)  02/16/19 208 lb (94.3 kg)  11/27/17 210 lb (95.3 kg)       ASSESSMENT AND PLAN:  Atherosclerosis of native coronary artery of native heart with stable angina pectoris ([place]) - Plan: EKG 12-Lead Denies anginal symptoms, will schedule repeat lab work 2 months  Mixed hyperlipidemia On Crestor Zetia past 3 weeks, schedule lab work in 2 months time Goal LDL less than 70  Essential hypertension - Plan: EKG 12-Lead Back on his blood pressure medications past several weeks Recommend he monitor pressures at home at rest on the medicines Contact us if doses are too high, may be up to decrease some of the medications as he restarts his workout program   Total encounter time more than 25 minutes  Greater than 50% was spent in counseling and coordination of care with the patient     Orders Placed This Encounter  Procedures  . Comp Met (CMET)  . Lipid panel  . EKG 12-Lead     Signed, Esmond Plants, M.D., Ph.D. 02/28/2020  [place], [place]

## 2020-02-28 ENCOUNTER — Encounter: Payer: Self-pay | Admitting: Cardiovascular Disease

## 2020-02-28 ENCOUNTER — Other Ambulatory Visit: Payer: Self-pay

## 2020-02-28 ENCOUNTER — Ambulatory Visit (INDEPENDENT_AMBULATORY_CARE_PROVIDER_SITE_OTHER): Payer: BC Managed Care – PPO | Admitting: Cardiovascular Disease

## 2020-02-28 VITALS — BP 144/76 | HR 71 | Ht 70.0 in | Wt 208.0 lb

## 2020-02-28 DIAGNOSIS — E782 Mixed hyperlipidemia: Secondary | ICD-10-CM

## 2020-02-28 DIAGNOSIS — I25118 Atherosclerotic heart disease of native coronary artery with other forms of angina pectoris: Secondary | ICD-10-CM | POA: Diagnosis not present

## 2020-02-28 DIAGNOSIS — I10 Essential (primary) hypertension: Secondary | ICD-10-CM | POA: Diagnosis not present

## 2020-02-28 MED ORDER — ROSUVASTATIN CALCIUM 20 MG PO TABS: 20.0000 mg | ORAL_TABLET | Freq: Every day | ORAL | 3 refills | Status: DC

## 2020-02-28 MED ORDER — EZETIMIBE 10 MG PO TABS: 10.0000 mg | ORAL_TABLET | Freq: Every day | ORAL | 3 refills | Status: DC

## 2020-02-28 NOTE — Patient Instructions (Addendum)
Medication Instructions:  No changes  If you need a refill on your cardiac medications before your next appointment, please call your pharmacy.    Lab work: Lipids and CMP in about 2 months here in our office. Make sure to not eat or drink anything after midnight before except water with medications.     If you have labs (blood work) drawn today and your tests are completely normal, you will receive your results only by: Marland Kitchen MyChart Message (if you have MyChart) OR . A paper copy in the mail If you have any lab test that is abnormal or we need to change your treatment, we will call you to review the results.   Testing/Procedures: No new testing needed   Follow-Up: At [facility], you and your health needs are our priority.  As part of our continuing mission to provide you with exceptional heart care, we have created designated Provider Care Teams.  These Care Teams include your primary Cardiologist (physician) and Advanced Practice Providers (APPs -  Physician Assistants and Nurse Practitioners) who all work together to provide you with the care you need, when you need it.  . You will need a follow up appointment in 12 months   . Providers on your designated Care Team:   . Murray Hodgkins, NP . Christell Faith, PA-C . Marrianne Mood, PA-C  Any Other Special Instructions Will Be Listed Below (If Applicable).  For educational health videos Log in to : [email or web address] Or : [email or web address], password : [email or web address]

## 2020-03-31 DIAGNOSIS — R0981 Nasal congestion: Secondary | ICD-10-CM | POA: Diagnosis not present

## 2020-03-31 DIAGNOSIS — Z03818 Encounter for observation for suspected exposure to other biological agents ruled out: Secondary | ICD-10-CM | POA: Diagnosis not present

## 2020-03-31 DIAGNOSIS — Z20822 Contact with and (suspected) exposure to covid-19: Secondary | ICD-10-CM | POA: Diagnosis not present

## 2020-03-31 DIAGNOSIS — R05 Cough: Secondary | ICD-10-CM | POA: Diagnosis not present

## 2020-03-31 DIAGNOSIS — R5383 Other fatigue: Secondary | ICD-10-CM | POA: Diagnosis not present

## 2020-05-02 ENCOUNTER — Telehealth: Payer: Self-pay | Admitting: Cardiovascular Disease

## 2020-05-02 ENCOUNTER — Other Ambulatory Visit: Payer: Self-pay | Admitting: Cardiovascular Disease

## 2020-05-02 ENCOUNTER — Other Ambulatory Visit (INDEPENDENT_AMBULATORY_CARE_PROVIDER_SITE_OTHER): Payer: BC Managed Care – PPO

## 2020-05-02 ENCOUNTER — Other Ambulatory Visit: Payer: Self-pay

## 2020-05-02 DIAGNOSIS — I10 Essential (primary) hypertension: Secondary | ICD-10-CM

## 2020-05-02 DIAGNOSIS — E782 Mixed hyperlipidemia: Secondary | ICD-10-CM

## 2020-05-02 DIAGNOSIS — I25118 Atherosclerotic heart disease of native coronary artery with other forms of angina pectoris: Secondary | ICD-10-CM

## 2020-05-02 MED ORDER — EZETIMIBE 10 MG PO TABS: 10.0000 mg | ORAL_TABLET | Freq: Every day | ORAL | 3 refills | Status: DC

## 2020-05-02 MED ORDER — METOPROLOL SUCCINATE ER 25 MG PO TB24: 12.5000 mg | ORAL_TABLET | Freq: Every day | ORAL | 3 refills | Status: DC

## 2020-05-02 MED ORDER — LISINOPRIL 20 MG PO TABS: ORAL_TABLET | ORAL | 3 refills | Status: DC

## 2020-05-02 NOTE — Telephone Encounter (Signed)
Spoke with patient and he needs refill of fenofibrate which was prescribed initially by Dr. Inda Merlin, then he got refills from Dr. Haroldine Laws, and no longer sees him as well. He has had difficulty finding primary care provider in this area so provided him with number to [facility] to assist with finding PCP. Advised I would send in refills for other medications and would check with Dr. Rockey Situ about the fenofibrate since we don't typically use this medication. He stated that Dr. Haroldine Laws said it do much either but he did want to check about the possible refill. Advised that I would send to Dr. Rockey Situ for review and would give him a call back with recommendations. Requested that he continue all other medications and would send in those refills and would be in touch. He verbalized understanding of our conversation, agreement with plan, and had no further questions at this time.

## 2020-05-02 NOTE — Telephone Encounter (Signed)
Pt came into the office for labs today. He stated he needed refills for ezetimibe, lisinopril and fenofibrate. Pt is due to f/u in a year with Dr. Rockey Situ, filled ezetimibe and lisinopril for a year. However, fenofibrate is not listed on pt med list. Pt wanted to ask Dr. Rockey Situ is he needs to be taking fenofibrate.

## 2020-05-03 LAB — COMPREHENSIVE METABOLIC PANEL
ALT: 40 IU/L (ref 0–44)
AST: 31 IU/L (ref 0–40)
Albumin/Globulin Ratio: 2.3 — ABNORMAL HIGH (ref 1.2–2.2)
Albumin: 5 g/dL (ref 4.0–5.0)
Alkaline Phosphatase: 71 IU/L (ref 44–121)
BUN/Creatinine Ratio: 13 (ref 9–20)
BUN: 12 mg/dL (ref 6–24)
Bilirubin Total: 0.5 mg/dL (ref 0.0–1.2)
CO2: 23 mmol/L (ref 20–29)
Calcium: 9.6 mg/dL (ref 8.7–10.2)
Chloride: 104 mmol/L (ref 96–106)
Creatinine, Ser: 0.9 mg/dL (ref 0.76–1.27)
GFR calc Af Amer: 119 mL/min/{1.73_m2} (ref >59)
GFR calc non Af Amer: 103 mL/min/{1.73_m2} (ref >59)
Globulin, Total: 2.2 g/dL (ref 1.5–4.5)
Glucose: 100 mg/dL — ABNORMAL HIGH (ref 65–99)
Potassium: 4.8 mmol/L (ref 3.5–5.2)
Sodium: 141 mmol/L (ref 134–144)
Total Protein: 7.2 g/dL (ref 6.0–8.5)

## 2020-05-03 LAB — LIPID PANEL
Chol/HDL Ratio: 3.4 ratio (ref 0.0–5.0)
Cholesterol, Total: 143 mg/dL (ref 100–199)
HDL: 42 mg/dL (ref >39)
LDL Chol Calc (NIH): 77 mg/dL (ref 0–99)
Triglycerides: 139 mg/dL (ref 0–149)
VLDL Cholesterol Cal: 24 mg/dL (ref 5–40)

## 2020-05-03 MED ORDER — ROSUVASTATIN CALCIUM 40 MG PO TABS: 40.0000 mg | ORAL_TABLET | Freq: Every day | ORAL | 3 refills | Status: DC

## 2020-05-03 NOTE — Telephone Encounter (Signed)
Spoke with patient and reviewed provider recommendations. He was agreeable with plan and had no further questions. Advised that he can go to the [facility] for labs to be rechecked and I would mail him lab slips in the mail as a reminder with instructions. Will update his medication list and he verbalized understanding of all changes at this time with no further questions.

## 2020-05-03 NOTE — Addendum Note (Signed)
Addended by: Valora Corporal on: 05/03/2020 05:41 PM   Modules accepted: Orders

## 2020-05-03 NOTE — Telephone Encounter (Signed)
I think it is more important to try to get his LDL down to 60 I think I would prefer to skip the fenofibrate increase the Crestor up to 40 stay on Zetia Recheck lipids in 3 to 6 months Sometimes contraindication to have fenofibrate with high statin dosing

## 2020-05-04 ENCOUNTER — Telehealth: Payer: Self-pay | Admitting: *Deleted

## 2020-05-04 NOTE — Telephone Encounter (Signed)
Spoke with patient and reviewed results and recommendations. He verbalized understanding with no further questions at this time.

## 2020-05-04 NOTE — Telephone Encounter (Signed)
-----   Message from Minna Merritts, MD sent at 05/03/2020  4:02 PM EDT ----- Lab work reviewed Goal LDL typically less than 70, preferably 60 Most recent LDL 77 Could work on diet and exercise and recheck in 6 months Alternatively could increase Crestor up to 40 mg daily with Zetia with recheck later

## 2020-06-01 DIAGNOSIS — Z1152 Encounter for screening for COVID-19: Secondary | ICD-10-CM | POA: Diagnosis not present

## 2020-06-01 DIAGNOSIS — Z03818 Encounter for observation for suspected exposure to other biological agents ruled out: Secondary | ICD-10-CM | POA: Diagnosis not present

## 2020-06-01 DIAGNOSIS — Z20822 Contact with and (suspected) exposure to covid-19: Secondary | ICD-10-CM | POA: Diagnosis not present

## 2020-06-20 ENCOUNTER — Ambulatory Visit: Payer: BC Managed Care – PPO | Admitting: Cardiovascular Disease

## 2020-06-20 ENCOUNTER — Encounter: Payer: Self-pay | Admitting: Cardiovascular Disease

## 2020-06-20 ENCOUNTER — Other Ambulatory Visit: Payer: Self-pay

## 2020-06-20 VITALS — BP 132/92 | HR 70 | Ht 70.0 in | Wt 205.0 lb

## 2020-06-20 DIAGNOSIS — I25118 Atherosclerotic heart disease of native coronary artery with other forms of angina pectoris: Secondary | ICD-10-CM | POA: Diagnosis not present

## 2020-06-20 MED ORDER — METOPROLOL SUCCINATE ER 25 MG PO TB24
25.0000 mg | ORAL_TABLET | Freq: Every day | ORAL | 3 refills | Status: DC
Start: 2020-06-20 — End: 2021-06-08

## 2020-06-20 NOTE — Patient Instructions (Signed)
Call the office if symptoms do not improve  Medication Instructions:  No changes  If you need a refill on your cardiac medications before your next appointment, please call your pharmacy.    Lab work: No new labs needed   If you have labs (blood work) drawn today and your tests are completely normal, you will receive your results only by: Marland Kitchen MyChart Message (if you have MyChart) OR . A paper copy in the mail If you have any lab test that is abnormal or we need to change your treatment, we will call you to review the results.   Testing/Procedures: No new testing needed   Follow-Up: At [facility], you and your health needs are our priority.  As part of our continuing mission to provide you with exceptional heart care, we have created designated Provider Care Teams.  These Care Teams include your primary Cardiologist (physician) and Advanced Practice Providers (APPs -  Physician Assistants and Nurse Practitioners) who all work together to provide you with the care you need, when you need it.  . You will need a follow up appointment in 12 months  . Providers on your designated Care Team:   . Murray Hodgkins, NP . Christell Faith, PA-C . Marrianne Mood, PA-C  Any Other Special Instructions Will Be Listed Below (If Applicable).  COVID-19 Vaccine Information can be found at: [email or web address].uk For questions related to vaccine distribution or appointments, please email [email or web address] or call [phone].

## 2020-06-20 NOTE — Progress Notes (Signed)
Cardiology Office Note  Date:  06/20/2020   ID:  Lance, Terry 1975/11/03, MRN [id number]  PCP:  Josetta Huddle, MD   Chief Complaint  Patient presents with   other    Pt. c/o not feeling well for 3 weeks; symptoms of chest burning/tightness and shortness of breath when trying to run 1 mile or any amount of exertion.     HPI:  Lance Terry is a 44 y/o male with h/o  HTN,  HL,  CAD with NSTEMI 01/2009  in setting of heat stroke  Cath from 7/10 mid LAD lesion  50-60% Who presents for f/u of his coronary artery disease ,  history of non-STEMI  Last seen in clinic July 2020 At that time, was doing less exercise, weight was elevated Had episode of dizziness [place], possible dehydration Denied any chest pain concerning for angina No shortness of breath on exertion  Fighting a head cold, Bad URI, sinus, Covid negative x2 Tried to run, in cold, had some chest tightness and shortness of breath Feels something not right in the chest, some sx at rest  Was training for half marathon [place] in 2 weeks, does not know if he will be recovered by that time  Lab work reviewed July 2020 total cholesterol 150 LDL 44 04/2020: total chol 143  EKG personally reviewed by myself on todays visit NSR rate 70 bpm no significant ST or T wave changes  Previous records reviewed  non-STEMI 2010 Severe heatstroke requiring several days in hospital and IV fluids In that setting he had elevated troponin, cardiac catheterization work-up showing 50% mid LAD lesion Stress testing later in 2010 02/2009 ETT/Myoview. He exercised for 12:00 on Bruce protocol. EF 71%. No ischemia. 10/2012: ETT He exercised for 11:01 on Bruce protocol. Peak HR 181 No ischemia.  Cardiac CTA showing mild proximal LAD disease and 50% mid LAD disease 2016  2 children go to [facility]  PMH:   has a past medical history of Coronary artery disease, Hyperlipidemia, and Hypertension.  PSH:    Past Surgical History:   Procedure Laterality Date   SHOULDER SURGERY  2003    Current Outpatient Medications  Medication Sig Dispense Refill   aspirin 81 MG tablet Take 81 mg by mouth daily.     ezetimibe (ZETIA) 10 MG tablet Take 1 tablet (10 mg total) by mouth daily. 90 tablet 3   lisinopril (ZESTRIL) 20 MG tablet TAKE 1 TABLET (20 MG TOTAL) BY MOUTH DAILY. 90 tablet 3   metoprolol succinate (TOPROL-XL) 25 MG 24 hr tablet Take 0.5 tablets (12.5 mg total) by mouth daily. 45 tablet 3   Multiple Vitamins-Minerals (MULTIVITAL) tablet Take 1 tablet by mouth daily.       Omega-3 Fatty Acids (FISH OIL) 1000 MG CAPS Take by mouth 2 (two) times daily.       rosuvastatin (CRESTOR) 40 MG tablet Take 1 tablet (40 mg total) by mouth daily. 90 tablet 3   No current facility-administered medications for this visit.    Allergies:   Ibuprofen and Penicillins   Social History:  The patient  reports that he has quit smoking. He has never used smokeless tobacco. He reports current alcohol use. He reports that he does not use drugs.   Family History:   family history includes Coronary artery disease in his father, paternal grandfather, and another family member; Heart attack in his paternal grandfather; Heart attack (age of onset: 36) in his father.    Review  of Systems: Review of Systems  Constitutional: Negative.   HENT: Negative.   Respiratory: Negative.   Cardiovascular: Negative.   Gastrointestinal: Negative.   Musculoskeletal: Negative.   Neurological: Negative.   Psychiatric/Behavioral: Negative.   All other systems reviewed and are negative.   PHYSICAL EXAM: VS:  BP (!) 132/92 (BP Location: Left Arm, Patient Position: Sitting, Cuff Size: Normal)    Pulse 70    Ht 5' 10"  (1.778 m)    Wt 205 lb (93 kg)    SpO2 99%    BMI 29.41 kg/m  , BMI Body mass index is 29.41 kg/m. Constitutional:  oriented to person, place, and time. No distress.  HENT:  Head: Grossly normal Eyes:  no discharge. No scleral  icterus.  Neck: No JVD, no carotid bruits  Cardiovascular: Regular rate and rhythm, no murmurs appreciated Pulmonary/Chest: Clear to auscultation bilaterally, no wheezes or rails Abdominal: Soft.  no distension.  no tenderness.  Musculoskeletal: Normal range of motion Neurological:  normal muscle tone. Coordination normal. No atrophy Skin: Skin warm and dry Psychiatric: normal affect, pleasant   Recent Labs: 05/02/2020: ALT 40; BUN 12; Creatinine, Ser 0.90; Potassium 4.8; Sodium 141   Lipid Panel Lab Results  Component Value Date   CHOL 143 05/02/2020   HDL 42 05/02/2020   LDLCALC 77 05/02/2020   TRIG 139 05/02/2020    Wt Readings from Last 3 Encounters:  06/20/20 205 lb (93 kg)  02/28/20 208 lb (94.3 kg)  02/16/19 208 lb (94.3 kg)     ASSESSMENT AND PLAN:  Atherosclerosis of native coronary artery of native heart with stable angina pectoris ([facility]) -  Some atypical tightness post URI Monitor for now, if no improvement in sx, may need stress test/cardiac CTA He will call us back in the next week or so to let us know how he is feeling  Mixed hyperlipidemia On Crestor Zetia  HTN Continue metoprolol succinate 12.5 mg up to 25 daily  URI Appears to be recovering, still likely with some inflammation in the chest We will monitor for now   Total encounter time more than 25 minutes  Greater than 50% was spent in counseling and coordination of care with the patient   No orders of the defined types were placed in this encounter.    Signed, Esmond Plants, M.D., Ph.D. 06/20/2020  [place], [place]

## 2020-07-04 ENCOUNTER — Telehealth: Payer: Self-pay | Admitting: Cardiovascular Disease

## 2020-07-04 DIAGNOSIS — R079 Chest pain, unspecified: Secondary | ICD-10-CM

## 2020-07-04 NOTE — Telephone Encounter (Signed)
Patient states he is feeling somewhat better, but would like to proceed with his stress test scheduling. Please call to discuss. Patient states he thinks he wants to wait on the CTA.

## 2020-07-04 NOTE — Telephone Encounter (Signed)
Saw Dr Rockey Situ on 06/20/20: Atherosclerosis of native coronary artery of native heart with stable angina pectoris ([place]) -  Some atypical tightness post URI Monitor for now, if no improvement in sx, may need stress test/cardiac CTA He will call us back in the next week or so to let us know how he is feeling   Routing to Dr Rockey Situ to advise on what kind of stress test he wants for patient.

## 2020-07-06 NOTE — Telephone Encounter (Signed)
Spoke to patient. He does not feel any worse but does not feel any better since office visit with Dr Rockey Situ on 06/20/20. He would like to proceed with whatever testing Dr Rockey Situ recommends. Advised patient that we are waiting for Dr Rockey Situ to respond and will let him know as soon as we can. He was appreciative.

## 2020-07-06 NOTE — Telephone Encounter (Signed)
Patient calling back in still complaining of chest tightness, SOB and still not feeling right after his 11/30 appt. Patient wants to pursue further testing whether a CT or nuclear stress  Please advise promptly

## 2020-07-07 DIAGNOSIS — R0600 Dyspnea, unspecified: Secondary | ICD-10-CM | POA: Diagnosis not present

## 2020-07-07 DIAGNOSIS — I10 Essential (primary) hypertension: Secondary | ICD-10-CM | POA: Diagnosis not present

## 2020-07-07 DIAGNOSIS — Z0000 Encounter for general adult medical examination without abnormal findings: Secondary | ICD-10-CM | POA: Diagnosis not present

## 2020-07-07 DIAGNOSIS — I2510 Atherosclerotic heart disease of native coronary artery without angina pectoris: Secondary | ICD-10-CM | POA: Diagnosis not present

## 2020-07-07 DIAGNOSIS — Z1159 Encounter for screening for other viral diseases: Secondary | ICD-10-CM | POA: Diagnosis not present

## 2020-07-07 DIAGNOSIS — E785 Hyperlipidemia, unspecified: Secondary | ICD-10-CM | POA: Diagnosis not present

## 2020-07-07 DIAGNOSIS — R972 Elevated prostate specific antigen [PSA]: Secondary | ICD-10-CM | POA: Diagnosis not present

## 2020-07-11 DIAGNOSIS — Z01818 Encounter for other preprocedural examination: Secondary | ICD-10-CM | POA: Diagnosis not present

## 2020-07-11 DIAGNOSIS — R0602 Shortness of breath: Secondary | ICD-10-CM | POA: Diagnosis not present

## 2020-07-11 NOTE — Telephone Encounter (Signed)
We can order stress test, leximyoview If he wants with me around in hospital , I am there 12/30 and could call him with results that afternoon

## 2020-07-11 NOTE — Telephone Encounter (Signed)
Left pt a message regarding call back for his chest tightness and SHOB, advised that Dr. Rockey Situ stated we could do a stress test/lexiscan. Explained to call back if this is something he would like to have done and it can be schedule. Order placed.

## 2020-07-17 ENCOUNTER — Other Ambulatory Visit: Payer: Self-pay | Admitting: Internal Medicine

## 2020-07-17 DIAGNOSIS — R9389 Abnormal findings on diagnostic imaging of other specified body structures: Secondary | ICD-10-CM

## 2020-07-20 ENCOUNTER — Other Ambulatory Visit: Payer: Self-pay

## 2020-07-20 ENCOUNTER — Encounter
Admission: RE | Admit: 2020-07-20 | Discharge: 2020-07-20 | Disposition: A | Discharge status: Home / Self Care | Payer: BC Managed Care – PPO | Source: Ambulatory Visit | Attending: Cardiovascular Disease | Admitting: Cardiovascular Disease

## 2020-07-20 DIAGNOSIS — R079 Chest pain, unspecified: Secondary | ICD-10-CM | POA: Diagnosis not present

## 2020-07-20 LAB — NM MYOCAR MULTI W/SPECT W/WALL MOTION / EF
LV dias vol: 59 mL (ref 62–150)
LV sys vol: 18 mL
Peak HR: 127 {beats}/min
Percent HR: 72 %
Rest HR: 82 {beats}/min
SDS: 1
SRS: 0
SSS: 0
TID: 1.31

## 2020-07-20 MED ORDER — TECHNETIUM TC 99M TETROFOSMIN IV KIT
9.9650 | PACK | Freq: Once | INTRAVENOUS | Status: AC | PRN
  Administered 2020-07-20: 9.965 via INTRAVENOUS

## 2020-07-20 MED ORDER — TECHNETIUM TC 99M TETROFOSMIN IV KIT
30.0000 | PACK | Freq: Once | INTRAVENOUS | Status: AC | PRN
  Administered 2020-07-20: 30.98 via INTRAVENOUS

## 2020-07-20 MED ORDER — REGADENOSON 0.4 MG/5ML IV SOLN
0.4000 mg | Freq: Once | INTRAVENOUS | Status: AC
  Administered 2020-07-20: 0.4 mg via INTRAVENOUS

## 2020-07-24 ENCOUNTER — Other Ambulatory Visit: Payer: Self-pay

## 2020-07-24 ENCOUNTER — Telehealth: Payer: Self-pay

## 2020-07-24 ENCOUNTER — Ambulatory Visit
Admission: RE | Admit: 2020-07-24 | Discharge: 2020-07-24 | Disposition: A | Discharge status: Home / Self Care | Payer: BC Managed Care – PPO | Source: Ambulatory Visit | Attending: Internal Medicine | Admitting: Internal Medicine

## 2020-07-24 DIAGNOSIS — R079 Chest pain, unspecified: Secondary | ICD-10-CM

## 2020-07-24 DIAGNOSIS — R0602 Shortness of breath: Secondary | ICD-10-CM | POA: Diagnosis not present

## 2020-07-24 DIAGNOSIS — R9389 Abnormal findings on diagnostic imaging of other specified body structures: Secondary | ICD-10-CM | POA: Diagnosis not present

## 2020-07-24 DIAGNOSIS — R7989 Other specified abnormal findings of blood chemistry: Secondary | ICD-10-CM | POA: Diagnosis not present

## 2020-07-24 NOTE — Telephone Encounter (Signed)
Attempted to reach pt about recent stress test results, pt reports seen results on his MyChart, however before discussing possible echo, phone was disconnected, call back and left VM to call back.

## 2020-07-24 NOTE — Telephone Encounter (Signed)
Pt called back regarding his recent stress test, reported to pt that Dr. Rockey Situ review results and advised "Stress test did not show ischemia  There was some GI uptake artifact which made estimate his cardiac function/ejection fraction difficult  This likely underestimated the EF down to 41% (likely from GI uptake)  Could consider echocardiogram to confirm ejection fraction and any other structural disease if he wants "  Pt is agreeable with going forward with echo, placed on schedule for 07/25/20 at 07:30am. At this time no medication changes, will call pt when echo report is reviewed with Dr. Rockey Situ. Otherwise all questions or concerns were address and no additional concerns at this time. Agreeable to plan, will call back for anything further.

## 2020-07-24 NOTE — Telephone Encounter (Signed)
Patient returning call.

## 2020-07-25 ENCOUNTER — Ambulatory Visit (INDEPENDENT_AMBULATORY_CARE_PROVIDER_SITE_OTHER): Payer: BC Managed Care – PPO

## 2020-07-25 DIAGNOSIS — R079 Chest pain, unspecified: Secondary | ICD-10-CM

## 2020-07-25 DIAGNOSIS — R0602 Shortness of breath: Secondary | ICD-10-CM

## 2020-07-25 LAB — ECHOCARDIOGRAM COMPLETE
AR max vel: 3 cm2
AV Area VTI: 2.78 cm2
AV Area mean vel: 2.93 cm2
AV Mean grad: 3 mmHg
AV Peak grad: 5.1 mmHg
Ao pk vel: 1.13 m/s
Area-P 1/2: 3.48 cm2
Calc EF: 53 %
S' Lateral: 2.6 cm
Single Plane A2C EF: 54.3 %
Single Plane A4C EF: 54 %

## 2020-07-28 ENCOUNTER — Other Ambulatory Visit: Payer: Self-pay | Admitting: Internal Medicine

## 2020-07-28 DIAGNOSIS — R7989 Other specified abnormal findings of blood chemistry: Secondary | ICD-10-CM

## 2020-07-31 ENCOUNTER — Telehealth: Payer: Self-pay

## 2020-07-31 NOTE — Telephone Encounter (Signed)
Able to reach pt regarding his recent echo, Dr. Rockey Situ had a chance to review his results and advised   "Echocardiogram  with normal cardiac function, no significant valve disease  Overall good looking study"  Pt delighted of results, understands to stay on current medications, no changes at this time, otherwise all questions or concerns were address and no additional concerns at this time. Agreeable to plan, will call back for anything further.

## 2020-08-03 ENCOUNTER — Other Ambulatory Visit
Admission: RE | Admit: 2020-08-03 | Discharge: 2020-08-03 | Disposition: A | Discharge status: Home / Self Care | Payer: BC Managed Care – PPO | Source: Ambulatory Visit | Attending: Specialist | Admitting: Specialist

## 2020-08-03 DIAGNOSIS — R0600 Dyspnea, unspecified: Secondary | ICD-10-CM | POA: Insufficient documentation

## 2020-08-03 LAB — FIBRIN DERIVATIVES D-DIMER (ARMC ONLY): Fibrin derivatives D-dimer (ARMC): 244.82 ng/mL (FEU) (ref 0.00–499.00)

## 2020-08-04 ENCOUNTER — Ambulatory Visit
Admission: RE | Admit: 2020-08-04 | Discharge: 2020-08-04 | Disposition: A | Discharge status: Home / Self Care | Payer: BC Managed Care – PPO | Source: Ambulatory Visit | Attending: Internal Medicine | Admitting: Internal Medicine

## 2020-08-04 ENCOUNTER — Other Ambulatory Visit: Payer: Self-pay

## 2020-08-04 DIAGNOSIS — R7989 Other specified abnormal findings of blood chemistry: Secondary | ICD-10-CM | POA: Diagnosis not present

## 2020-08-14 DIAGNOSIS — R9389 Abnormal findings on diagnostic imaging of other specified body structures: Secondary | ICD-10-CM | POA: Diagnosis not present

## 2020-08-14 DIAGNOSIS — I2510 Atherosclerotic heart disease of native coronary artery without angina pectoris: Secondary | ICD-10-CM | POA: Diagnosis not present

## 2020-08-14 DIAGNOSIS — E785 Hyperlipidemia, unspecified: Secondary | ICD-10-CM | POA: Diagnosis not present

## 2020-08-14 DIAGNOSIS — I10 Essential (primary) hypertension: Secondary | ICD-10-CM | POA: Diagnosis not present

## 2020-09-04 DIAGNOSIS — U099 Post covid-19 condition, unspecified: Secondary | ICD-10-CM | POA: Diagnosis not present

## 2020-09-04 DIAGNOSIS — R0600 Dyspnea, unspecified: Secondary | ICD-10-CM | POA: Diagnosis not present

## 2020-09-05 DIAGNOSIS — B948 Sequelae of other specified infectious and parasitic diseases: Secondary | ICD-10-CM | POA: Diagnosis not present

## 2020-10-18 ENCOUNTER — Telehealth: Payer: Self-pay | Admitting: Cardiovascular Disease

## 2020-10-18 NOTE — Telephone Encounter (Signed)
Attempted to call pt back, no answer, LM to call the clinic back.

## 2020-10-18 NOTE — H&P (View-Only) (Signed)
Office Visit    Patient Name: Lance Terry Date of Encounter: 10/20/2020  PCP:  Adin Hector, MD   [place]  Cardiologist:  Ida Rogue, MD  Advanced Practice Provider:  No care team member to display Electrophysiologist:  None   Chief Complaint    Lance Terry is a 45 y.o. male with a hx of HTN, HLD, CAD with NSTEMI 01/2009 in setting of heat stroke with cath showing mid LAD lesion 50-60% with medical management since that time presents today for shortness of breath and chest discomfort  Past Medical History    Past Medical History:  Diagnosis Date  . Coronary artery disease    -small NSTEMI in setting of heat stroke 7/10 -Cath 50-60% LAD; 7/10 -ET.[name]: EF 71% no ischemia 7/10   . Hyperlipidemia    mixed  . Hypertension    Past Surgical History:  Procedure Laterality Date  . SHOULDER SURGERY  2003    Allergies  Allergies  Allergen Reactions  . Ibuprofen   . Penicillins     History of Present Illness    Lance Terry is a 45 y.o. male with a hx of HTN, HLD, CAD with NSTEMI 01/2009 in setting of heat stroke with cath showing mid LAD lesion 50-60% with medical management last seen 05/2020 by Dr. Rockey Situ.  His CAD is to be NSTEMI 01/2009 in the setting of heat stroke.  Catheterization at that time showed mid LAD lesion 50-60% stenosed.  Subsequent ETT/Myoview 02/2009 with EF 71%, exercised for 12 minutes on Bruce protocol, no evidence of ischemia.  He had repeat exercise tolerance test 10/2012 at which time he exercised for 11 minutes of Bruce protocol, peak heart rate 181, no ischemia.  A cardiac CTA in 2016 showed mild proximal LAD disease with 50% mid LAD disease.  When last seen 05/2020 he noted chest discomfort in context of URI. Subsequent lexiscan myoview 06/2020 was low risk with no evidence of ischemia though EF measured at 41% likely due to GI uptake artifact. Subsequent echocardiogram 07/25/20 LVEF 55-60%, no RWMA,  indeterminite diastolic parameters, trivial MR.  He called the office earlier this week stating he was not feeling well, noted increase shortness of breath, chest discomfort with walking and deep breathing.  Tells me in November he had a bad cold, felt better after a week, then for 3-4 weeks would have chest discomfort every time he tried to run. Previously was training for half marathon. Now back up to walking some but still with significant exertional dyspnea and sensation of chest tightness/burning in his midsternal region with exertion.   Had some breathing tests done which he tells me were unremarkable. Went to [facility] clinic - he is on his second round of Flovent with minimal improvement. Underwent testing to see if his previous URI was COVID and that returned negative.   He is understandably very concerned about his persistent symptoms of dyspnea and chest pain.   No edema, orthopnea, PND. Reports no palpitations.   EKGs/Labs/Other Studies Reviewed:   The following studies were reviewed today: Lexiscan Myoview 07/20/20 Pharmacological myocardial perfusion imaging study with no significant  Ischemia Normal focal wall motion abnormality, EF estimated at 41% (likely depressed secondary to significant GI uptake artifact, most notable on attenuation corrected images) No EKG changes concerning for ischemia at peak stress or in recovery. CT attenuation correction images with with no aortic atherosclerosis or coronary calcification Low risk scan.  Consider echocardiogram  to confirm ejection fraction  Echo 07/25/20  1. Left ventricular ejection fraction, by estimation, is 55 to 60%. The  left ventricle has normal function. The left ventricle has no regional  wall motion abnormalities. Left ventricular diastolic parameters are  indeterminate.   2. Right ventricular systolic function is normal. The right ventricular  size is normal.   3. The mitral valve is normal in structure. Trivial  mitral valve  regurgitation. No evidence of mitral stenosis.   4. The aortic valve has an indeterminant number of cusps. Aortic valve  regurgitation is not visualized. No aortic stenosis is present.   EKG:  EKG is ordered today.  The ekg ordered today demonstrates NSR 76 bpm with no acute ST/T wave changes.   Recent Labs: 05/02/2020: ALT 40; BUN 12; Creatinine, Ser 0.90; Potassium 4.8; Sodium 141  Recent Lipid Panel    Component Value Date/Time   CHOL 143 05/02/2020 0814   TRIG 139 05/02/2020 0814   HDL 42 05/02/2020 0814   CHOLHDL 3.4 05/02/2020 0814   LDLCALC 77 05/02/2020 0814     Home Medications   Current Meds  Medication Sig  . albuterol (VENTOLIN HFA) 108 (90 Base) MCG/ACT inhaler Inhale into the lungs as needed for wheezing or shortness of breath.  Marland Kitchen aspirin 81 MG tablet Take 81 mg by mouth daily.  Marland Kitchen ezetimibe (ZETIA) 10 MG tablet Take 1 tablet (10 mg total) by mouth daily.  . Fluticasone Propionate HFA (FLOVENT HFA IN) Inhale into the lungs in the morning and at bedtime.  Marland Kitchen lisinopril (ZESTRIL) 20 MG tablet TAKE 1 TABLET (20 MG TOTAL) BY MOUTH DAILY.  . metoprolol succinate (TOPROL-XL) 25 MG 24 hr tablet Take 1 tablet (25 mg total) by mouth daily.  . Multiple Vitamins-Minerals (MULTIVITAL) tablet Take 1 tablet by mouth daily.  . Omega-3 Fatty Acids (FISH OIL) 1000 MG CAPS Take by mouth 2 (two) times daily.  . rosuvastatin (CRESTOR) 40 MG tablet Take 1 tablet (40 mg total) by mouth daily.  Marland Kitchen VITAMIN D PO Take 600-1,000 Units by mouth daily.     Review of Systems  All other systems reviewed and are otherwise negative except as noted above.  Physical Exam    VS:  BP 118/90 (BP Location: Left Arm, Patient Position: Sitting, Cuff Size: Normal)   Pulse 76   Ht 5' 10"  (1.778 m)   Wt 216 lb (98 kg)   SpO2 97%   BMI 30.99 kg/m  , BMI Body mass index is 30.99 kg/m.  Wt Readings from Last 3 Encounters:  10/20/20 216 lb (98 kg)  06/20/20 205 lb (93 kg)  02/28/20 208  lb (94.3 kg)    GEN: Well nourished, well developed, in no acute distress. HEENT: normal. Neck: Supple, no JVD, carotid bruits, or masses. Cardiac: RRR, no murmurs, rubs, or gallops. No clubbing, cyanosis, edema.  Radials/DP/PT 2+ and equal bilaterally.  Respiratory:  Respirations regular and unlabored, clear to auscultation bilaterally. GI: Soft, nontender, nondistended. MS: No deformity or atrophy. Skin: Warm and dry, no rash. Neuro:  Strength and sensation are intact. Psych: Normal affect.  Assessment & Plan    1. CAD -Catheterization 2010 with 50-60% mid LAD stenosis recommended for medical management.  Lexiscan Myoview 06/2020 no evidence of ischemia.  Echo 07/2020 normal LVEF, no RWMA, trivial MR.  EKG today normal sinus rhythm with no acute ST/2 changes.  Reports persistent dyspnea on exertion and exertional chest discomfort.  We discussed further evaluation with cardiac CTA versus cardiac  catheterization. The risks [stroke (1 in 1000), death (1 in 64), kidney failure [usually temporary] (1 in 500), bleeding (1 in 200), allergic reaction [possibly serious] (1 in 200)], benefits (diagnostic support and management of coronary artery disease) and alternatives of a cardiac catheterization were discussed in detail with Mr. Guzzo and he wishes to discuss cardiac catheterization vs cardiac CTA with his wife prior to proceeding. As his exertional symptoms are stable we discussed that either study would be appropriate. He does note that he may wish to have his procedure done at [facility], he understands that Dr. Rockey Situ only performs cardiac catheterizations at [facility].  2. HTN - BP well controlled. Continue current antihypertensive regimen including lisinopril 20 mg daily, Toprol 25 mg daily.  3. HLD, LDL goal <70 -continue rosuvastatin 40 mg daily, Zetia 10 mg daily.  Lipid panel 05/02/2020 LDL 77.  Would recommend lipid panel with next lab work collected.  Disposition: Follow up in 1 month(s)  with Dr. Rockey Situ or APP   Signed, [name], NP 10/20/2020, 8:22 AM [place]

## 2020-10-18 NOTE — Progress Notes (Signed)
Office Visit    Patient Name: Lance Terry Date of Encounter: 10/20/2020  PCP:  Adin Hector, MD   [place]  Cardiologist:  Ida Rogue, MD  Advanced Practice Provider:  No care team member to display Electrophysiologist:  None   Chief Complaint    MALICK NETZ is a 45 y.o. male with a hx of HTN, HLD, CAD with NSTEMI 01/2009 in setting of heat stroke with cath showing mid LAD lesion 50-60% with medical management since that time presents today for shortness of breath and chest discomfort  Past Medical History    Past Medical History:  Diagnosis Date  . Coronary artery disease    -small NSTEMI in setting of heat stroke 7/10 -Cath 50-60% LAD; 7/10 -ET.Lance Terry: EF 71% no ischemia 7/10   . Hyperlipidemia    mixed  . Hypertension    Past Surgical History:  Procedure Laterality Date  . SHOULDER SURGERY  2003    Allergies  Allergies  Allergen Reactions  . Ibuprofen   . Penicillins     History of Present Illness    Lance Terry is a 45 y.o. male with a hx of HTN, HLD, CAD with NSTEMI 01/2009 in setting of heat stroke with cath showing mid LAD lesion 50-60% with medical management last seen 05/2020 by Dr. Rockey Situ.  His CAD is to be NSTEMI 01/2009 in the setting of heat stroke.  Catheterization at that time showed mid LAD lesion 50-60% stenosed.  Subsequent ETT/Myoview 02/2009 with EF 71%, exercised for 12 minutes on Bruce protocol, no evidence of ischemia.  He had repeat exercise tolerance test 10/2012 at which time he exercised for 11 minutes of Bruce protocol, peak heart rate 181, no ischemia.  A cardiac CTA in 2016 showed mild proximal LAD disease with 50% mid LAD disease.  When last seen 05/2020 he noted chest discomfort in context of URI. Subsequent lexiscan myoview 06/2020 was low risk with no evidence of ischemia though EF measured at 41% likely due to GI uptake artifact. Subsequent echocardiogram 07/25/20 LVEF 55-60%, no RWMA,  indeterminite diastolic parameters, trivial MR.  He called the office earlier this week stating he was not feeling well, noted increase shortness of breath, chest discomfort with walking and deep breathing.  Tells me in November he had a bad cold, felt better after a week, then for 3-4 weeks would have chest discomfort every time he tried to run. Previously was training for half marathon. Now back up to walking some but still with significant exertional dyspnea and sensation of chest tightness/burning in his midsternal region with exertion.   Had some breathing tests done which he tells me were unremarkable. Went to [facility] clinic - he is on his second round of Flovent with minimal improvement. Underwent testing to see if his previous URI was COVID and that returned negative.   He is understandably very concerned about his persistent symptoms of dyspnea and chest pain.   No edema, orthopnea, PND. Reports no palpitations.   EKGs/Labs/Other Studies Reviewed:   The following studies were reviewed today: Lexiscan Myoview 07/20/20 Pharmacological myocardial perfusion imaging study with no significant  Ischemia Normal focal wall motion abnormality, EF estimated at 41% (likely depressed secondary to significant GI uptake artifact, most notable on attenuation corrected images) No EKG changes concerning for ischemia at peak stress or in recovery. CT attenuation correction images with with no aortic atherosclerosis or coronary calcification Low risk scan.  Consider echocardiogram  to confirm ejection fraction  Echo 07/25/20  1. Left ventricular ejection fraction, by estimation, is 55 to 60%. The  left ventricle has normal function. The left ventricle has no regional  wall motion abnormalities. Left ventricular diastolic parameters are  indeterminate.   2. Right ventricular systolic function is normal. The right ventricular  size is normal.   3. The mitral valve is normal in structure. Trivial  mitral valve  regurgitation. No evidence of mitral stenosis.   4. The aortic valve has an indeterminant number of cusps. Aortic valve  regurgitation is not visualized. No aortic stenosis is present.   EKG:  EKG is ordered today.  The ekg ordered today demonstrates NSR 76 bpm with no acute ST/T wave changes.   Recent Labs: 05/02/2020: ALT 40; BUN 12; Creatinine, Ser 0.90; Potassium 4.8; Sodium 141  Recent Lipid Panel    Component Value Date/Time   CHOL 143 05/02/2020 0814   TRIG 139 05/02/2020 0814   HDL 42 05/02/2020 0814   CHOLHDL 3.4 05/02/2020 0814   LDLCALC 77 05/02/2020 0814     Home Medications   Current Meds  Medication Sig  . albuterol (VENTOLIN HFA) 108 (90 Base) MCG/ACT inhaler Inhale into the lungs as needed for wheezing or shortness of breath.  Marland Kitchen aspirin 81 MG tablet Take 81 mg by mouth daily.  Marland Kitchen ezetimibe (ZETIA) 10 MG tablet Take 1 tablet (10 mg total) by mouth daily.  . Fluticasone Propionate HFA (FLOVENT HFA IN) Inhale into the lungs in the morning and at bedtime.  Marland Kitchen lisinopril (ZESTRIL) 20 MG tablet TAKE 1 TABLET (20 MG TOTAL) BY MOUTH DAILY.  . metoprolol succinate (TOPROL-XL) 25 MG 24 hr tablet Take 1 tablet (25 mg total) by mouth daily.  . Multiple Vitamins-Minerals (MULTIVITAL) tablet Take 1 tablet by mouth daily.  . Omega-3 Fatty Acids (FISH OIL) 1000 MG CAPS Take by mouth 2 (two) times daily.  . rosuvastatin (CRESTOR) 40 MG tablet Take 1 tablet (40 mg total) by mouth daily.  Marland Kitchen VITAMIN D PO Take 600-1,000 Units by mouth daily.     Review of Systems  All other systems reviewed and are otherwise negative except as noted above.  Physical Exam    VS:  BP 118/90 (BP Location: Left Arm, Patient Position: Sitting, Cuff Size: Normal)   Pulse 76   Ht 5' 10"  (1.778 m)   Wt 216 lb (98 kg)   SpO2 97%   BMI 30.99 kg/m  , BMI Body mass index is 30.99 kg/m.  Wt Readings from Last 3 Encounters:  10/20/20 216 lb (98 kg)  06/20/20 205 lb (93 kg)  02/28/20 208  lb (94.3 kg)    GEN: Well nourished, well developed, in no acute distress. HEENT: normal. Neck: Supple, no JVD, carotid bruits, or masses. Cardiac: RRR, no murmurs, rubs, or gallops. No clubbing, cyanosis, edema.  Radials/DP/PT 2+ and equal bilaterally.  Respiratory:  Respirations regular and unlabored, clear to auscultation bilaterally. GI: Soft, nontender, nondistended. MS: No deformity or atrophy. Skin: Warm and dry, no rash. Neuro:  Strength and sensation are intact. Psych: Normal affect.  Assessment & Plan    1. CAD -Catheterization 2010 with 50-60% mid LAD stenosis recommended for medical management.  Lexiscan Myoview 06/2020 no evidence of ischemia.  Echo 07/2020 normal LVEF, no RWMA, trivial MR.  EKG today normal sinus rhythm with no acute ST/2 changes.  Reports persistent dyspnea on exertion and exertional chest discomfort.  We discussed further evaluation with cardiac CTA versus cardiac  catheterization. The risks [stroke (1 in 1000), death (1 in 74), kidney failure [usually temporary] (1 in 500), bleeding (1 in 200), allergic reaction [possibly serious] (1 in 200)], benefits (diagnostic support and management of coronary artery disease) and alternatives of a cardiac catheterization were discussed in detail with Lance Terry and he wishes to discuss cardiac catheterization vs cardiac CTA with his wife prior to proceeding. As his exertional symptoms are stable we discussed that either study would be appropriate. He does note that he may wish to have his procedure done at [facility], he understands that Dr. Rockey Situ only performs cardiac catheterizations at [facility].  2. HTN - BP well controlled. Continue current antihypertensive regimen including lisinopril 20 mg daily, Toprol 25 mg daily.  3. HLD, LDL goal <70 -continue rosuvastatin 40 mg daily, Zetia 10 mg daily.  Lipid panel 05/02/2020 LDL 77.  Would recommend lipid panel with next lab work collected.  Disposition: Follow up in 1 month(s)  with Dr. Rockey Situ or APP   Signed, [name], NP 10/20/2020, 8:22 AM [place]

## 2020-10-18 NOTE — Telephone Encounter (Signed)
Patient returning call.

## 2020-10-18 NOTE — Telephone Encounter (Signed)
Was able to return pt's phone call regarding referral to Dr. Haroldine Laws. Advised that even though he was once under the care of Dr. Haroldine Laws over 5 years ago, that Dr. Haroldine Laws is now under the speciality of the [facility]. Based  On cardiac hx and past cardiologist progress notes, it is not noted that pt has HF, therefore, the referral would probably be denied.  Pt verbalized understanding, stated he "message him earlier and he tod me that, but said he would look at my chart to see if there is anything he can offer".   Pt reports he has not been feeling well, has not felt himself, reports he used to run 5 miles a day, but has not been able to for sometime. Pt has increase shob, discomfort to chest with walking and deep breathing. Pt denies CP, dizziness, or swelling to feet/ankles.   Requesting to be seen by Dr. Rockey Situ since it has been since 05/2020, noted Rockey Situ is booked till 5/3, pt feels that is too far out and would like to be sooner, okay with another provider. Was able to get him schedule for 4/1 at 8am with Laurann Montana, NP  Pt grateful for the appt and return call,  all questions or concerns were address and no additional concerns at this time.

## 2020-10-18 NOTE — Telephone Encounter (Signed)
Patient calling in stating he wants a referral to Dr. Missy Sabins. Patient previously saw  Dr. Missy Sabins in 2013 and wants to follow up and receive second opinion from him.   Please advise

## 2020-10-20 ENCOUNTER — Other Ambulatory Visit: Payer: Self-pay

## 2020-10-20 ENCOUNTER — Ambulatory Visit (INDEPENDENT_AMBULATORY_CARE_PROVIDER_SITE_OTHER): Payer: BC Managed Care – PPO | Admitting: Family

## 2020-10-20 ENCOUNTER — Encounter: Payer: Self-pay | Admitting: Family

## 2020-10-20 VITALS — BP 118/90 | HR 76 | Ht 70.0 in | Wt 216.0 lb

## 2020-10-20 DIAGNOSIS — I25118 Atherosclerotic heart disease of native coronary artery with other forms of angina pectoris: Secondary | ICD-10-CM

## 2020-10-20 DIAGNOSIS — I10 Essential (primary) hypertension: Secondary | ICD-10-CM | POA: Diagnosis not present

## 2020-10-20 DIAGNOSIS — E785 Hyperlipidemia, unspecified: Secondary | ICD-10-CM

## 2020-10-20 MED ORDER — NITROGLYCERIN 0.4 MG SL SUBL: 0.4000 mg | SUBLINGUAL_TABLET | SUBLINGUAL | 3 refills | Status: DC | PRN

## 2020-10-20 NOTE — Patient Instructions (Addendum)
Medication Instructions:  Your physician has recommended you make the following change in your medication:   START Nitroglycerin 0.82m as needed for chest pain  *If you need a refill on your cardiac medications before your next appointment, please call your pharmacy*  Lab Work: None ordered today.    Testing/Procedures: We discussed a cardiac catheterization versus a cardiac CTA. Simply call our office with which test you which to have done- [phone].   Cardiac computed tomography (CT) is a painless test that uses an x-ray machine to take clear, detailed pictures of your heart. Please follow instruction sheet as given.   Cardiac catheterization is used to diagnose and/or treat various heart conditions. Doctors may recommend this procedure for a number of different reasons. The most common reason is to evaluate chest pain. Chest pain can be a symptom of coronary artery disease (CAD), and cardiac catheterization can show whether plaque is narrowing or blocking your heart's arteries. This procedure is also used to evaluate the valves, as well as measure the blood flow and oxygen levels in different parts of your heart.    Follow-Up: [facility] needs are our [facility].  As part of our continuing mission to provide you with exceptional heart care, we have created designated Provider Care Teams.  These Care Teams include your primary Cardiologist (physician) and Advanced Practice Providers (APPs -  Physician Assistants and Nurse Practitioners) who all work together to provide you with the care you need, when you need it.  We recommend signing up for the patient portal called "MyChart".  Sign up information is provided on this After Visit Summary.  MyChart is used to connect with patients for Virtual Visits (Telemedicine).  Patients are able to view lab/test results, encounter notes, upcoming appointments, etc.  Non-urgent messages can be sent to your provider as well.    To learn more about what you can do with MyChart, go to [email or web address]    Your next appointment:   1 month(s)  The format for your next appointment:   In Person  Provider:   You may see TIda Rogue MD or one of the following Advanced Practice Providers on your designated Care Team:    CMurray Hodgkins NP  RChristell Faith PA-C  JMarrianne Mood PA-C  Cadence FEdinburg PVermont CLaurann Montana NP     Cardiac CT Angiogram A cardiac CT angiogram is a procedure to look at the heart and the area around the heart. It may be done to help find the cause of chest pains or other symptoms of heart disease. During this procedure, a substance called contrast dye is injected into the blood vessels in the area to be checked. A large X-ray machine, called a CT scanner, then takes detailed pictures of the heart and the surrounding area. The procedure is also sometimes called a coronary CT angiogram, coronary artery scanning, or CTA. A cardiac CT angiogram allows the health care provider to see how well blood is flowing to and from the heart. The health care provider will be able to see if there are any problems, such as:  Blockage or narrowing of the coronary arteries in the heart.  Fluid around the heart.  Signs of weakness or disease in the muscles, valves, and tissues of the heart. Tell a health care provider about:  Any allergies you have. This is especially important if you have had a previous allergic reaction to contrast dye.  All medicines you are taking, including vitamins,  herbs, eye drops, creams, and over-the-counter medicines.  Any blood disorders you have.  Any surgeries you have had.  Any medical conditions you have.  Whether you are pregnant or may be pregnant.  Any anxiety disorders, chronic pain, or other conditions you have that may increase your stress or prevent you from lying still. What are the risks? Generally, this is a safe procedure. However,  problems may occur, including:  Bleeding.  Infection.  Allergic reactions to medicines or dyes.  Damage to other structures or organs.  Kidney damage from the contrast dye that is used.  Increased risk of cancer from radiation exposure. This risk is low. Talk with your health care provider about: ? The risks and benefits of testing. ? How you can receive the lowest dose of radiation. What happens before the procedure?  Wear comfortable clothing and remove any jewelry, glasses, dentures, and hearing aids.  Follow instructions from your health care provider about eating and drinking. This may include: ? For 12 hours before the procedure -- avoid caffeine. This includes tea, coffee, soda, energy drinks, and diet pills. Drink plenty of water or other fluids that do not have caffeine in them. Being well hydrated can prevent complications. ? For 4-6 hours before the procedure -- stop eating and drinking. The contrast dye can cause nausea, but this is less likely if your stomach is empty.  Ask your health care provider about changing or stopping your regular medicines. This is especially important if you are taking diabetes medicines, blood thinners, or medicines to treat problems with erections (erectile dysfunction). What happens during the procedure?  Hair on your chest may need to be removed so that small sticky patches called electrodes can be placed on your chest. These will transmit information that helps to monitor your heart during the procedure.  An IV will be inserted into one of your veins.  You might be given a medicine to control your heart rate during the procedure. This will help to ensure that good images are obtained.  You will be asked to lie on an exam table. This table will slide in and out of the CT machine during the procedure.  Contrast dye will be injected into the IV. You might feel warm, or you may get a metallic taste in your mouth.  You will be given a  medicine called nitroglycerin. This will relax or dilate the arteries in your heart.  The table that you are lying on will move into the CT machine tunnel for the scan.  The person running the machine will give you instructions while the scans are being done. You may be asked to: ? Keep your arms above your head. ? Hold your breath. ? Stay very still, even if the table is moving.  When the scanning is complete, you will be moved out of the machine.  The IV will be removed. The procedure may vary among health care providers and hospitals.   What can I expect after the procedure? After your procedure, it is common to have:  A metallic taste in your mouth from the contrast dye.  A feeling of warmth.  A headache from the nitroglycerin. Follow these instructions at home:  Take over-the-counter and prescription medicines only as told by your health care provider.  If you are told, drink enough fluid to keep your urine pale yellow. This will help to flush the contrast dye out of your body.  Most people can return to their normal activities right  after the procedure. Ask your health care provider what activities are safe for you.  It is up to you to get the results of your procedure. Ask your health care provider, or the department that is doing the procedure, when your results will be ready.  Keep all follow-up visits as told by your health care provider. This is important. Contact a health care provider if:  You have any symptoms of allergy to the contrast dye. These include: ? Shortness of breath. ? Rash or hives. ? A racing heartbeat. Summary  A cardiac CT angiogram is a procedure to look at the heart and the area around the heart. It may be done to help find the cause of chest pains or other symptoms of heart disease.  During this procedure, a large X-ray machine, called a CT scanner, takes detailed pictures of the heart and the surrounding area after a contrast dye has been  injected into blood vessels in the area.  Ask your health care provider about changing or stopping your regular medicines before the procedure. This is especially important if you are taking diabetes medicines, blood thinners, or medicines to treat erectile dysfunction.  If you are told, drink enough fluid to keep your urine pale yellow. This will help to flush the contrast dye out of your body. This information is not intended to replace advice given to you by your health care provider. Make sure you discuss any questions you have with your health care provider.     Coronary Angiogram (Cardiac Catheterization)  A coronary angiogram is an X-ray procedure that is used to examine the arteries in the heart. Contrast dye is injected through a long, thin tube (catheter) into these arteries. Then X-rays are taken to show any blockage in these arteries. You may have this procedure if you:  Are having chest pain, or other symptoms of angina, and you are at risk for heart disease.  Have an abnormal stress test or test of your heart's electrical activity (electrocardiogram, or ECG).  Have chest pain and heart failure.  Are having irregular heart rhythms. A coronary angiogram or heart catheterization can show if you have valve disease or a disease of the aorta. This procedure can also be used to check the overall function of your heart muscle. Let your health care provider know about:  Any allergies you have, including allergies to medicines or contrast dye.  All medicines you are taking, including vitamins, herbs, eye drops, creams, and over-the-counter medicines.  Any problems you or family members have had with anesthetic medicines.  Any blood disorders you have.  Any surgeries you have had.  Any history of kidney problems or kidney failure.  Any medical conditions you have.  Whether you are pregnant or may be pregnant.  Whether you are breastfeeding. What are the risks? Generally,  this is a safe procedure. However, problems may occur, including:  Infection.  Allergic reaction to medicines or dyes that are used.  Bleeding from the insertion site or other places.  Damage to nearby structures, such as blood vessels, or damage to kidneys from contrast dye.  Irregular heart rhythms.  Stroke (rare).  Heart attack (rare). What happens before the procedure? Staying hydrated Follow instructions from your health care provider about hydration, which may include:  Up to 2 hours before the procedure - you may continue to drink clear liquids, such as water, clear fruit juice, black coffee, and plain tea.   Eating and drinking restrictions Follow instructions from your  health care provider about eating and drinking, which may include:  8 hours before the procedure - stop eating heavy meals or foods, such as meat, fried foods, or fatty foods.  6 hours before the procedure - stop eating light meals or foods, such as toast or cereal.  6 hours before the procedure - stop drinking milk or drinks that contain milk.  2 hours before the procedure - stop drinking clear liquids. Medicines Ask your health care provider about:  Changing or stopping your regular medicines. This is especially important if you are taking diabetes medicines or blood thinners.  Taking medicines such as aspirin and ibuprofen. These medicines can thin your blood. Do not take these medicines unless your health care provider tells you to take them. Aspirin may be recommended before coronary angiograms even if you do not normally take it.  Taking over-the-counter medicines, vitamins, herbs, and supplements. General instructions  Do not use any products that contain nicotine or tobacco for at least 4 weeks before the procedure. These products include cigarettes, e-cigarettes, and chewing tobacco. If you need help quitting, ask your health care provider.  You may have an exam or testing.  Plan to have  someone take you home from the hospital or clinic.  If you will be going home right after the procedure, plan to have someone with you for 24 hours.  Ask your health care provider: ? How your insertion site will be marked. ? What steps will be taken to help prevent infection. These may include:  Removing hair at the insertion site.  Washing skin with a germ-killing soap.  Taking antibiotic medicine. What happens during the procedure?  You will lie on your back on an X-ray table.  An IV will be inserted into one of your veins.  Electrodes will be placed on your chest.  You will be given one or more of the following: ? A medicine to help you relax (sedative). ? A medicine to numb the catheter insertion area (local anesthetic).  You will be connected to a continuous ECG monitor.  The catheter will be inserted into an artery in one of these areas: ? Your groin area in your upper thigh. ? Your wrist. ? The fold of your arm, near your elbow.  An X-ray procedure (fluoroscopy) will be used to help guide the catheter to the opening of the blood vessel to be used.  A dye will be injected into the catheter and X-rays will be taken. The dye will help to show any narrowing or blockages in the heart arteries.  Tell your health care provider if you have chest pain or trouble breathing.  If blockages are found, another procedure may be done to open the artery.  The catheter will be removed after the fluoroscopy is complete.  A bandage (dressing) will be placed over the insertion site. Pressure will be applied to stop bleeding.  The IV will be removed. The procedure may vary among health care providers and hospitals.   What happens after the procedure?  Your blood pressure, heart rate, breathing rate, and blood oxygen level will be monitored until you leave the hospital or clinic.  You will need to lie still for a few hours, or for as long as told by your health care provider. ? If  the procedure is done through the groin, you will be told not to bend or cross your legs.  The insertion site and the pulse in your foot or wrist will be checked  often.  More blood tests, X-rays, and an ECG may be done.  Do not drive for 24 hours if you were given a sedative during your procedure. Summary  A coronary angiogram is an X-ray procedure that is used to examine the arteries in the heart.  Contrast dye is injected through a long, thin tube (catheter) into each artery.  Tell your health care provider about any allergies you have, including allergies to contrast dye.  After the procedure, you will need to lie still for a few hours and drink plenty of fluids. This information is not intended to replace advice given to you by your health care provider. Make sure you discuss any questions you have with your health care provider. Document Revised: 01/28/2019 Document Reviewed: 01/28/2019 Elsevier Patient Education  [place].

## 2020-10-23 NOTE — Addendum Note (Signed)
Addended by: Britt Bottom on: 10/23/2020 01:16 PM   Modules accepted: Orders

## 2020-10-24 ENCOUNTER — Telehealth: Payer: Self-pay | Admitting: Cardiovascular Disease

## 2020-10-24 DIAGNOSIS — R072 Precordial pain: Secondary | ICD-10-CM

## 2020-10-24 MED ORDER — METOPROLOL TARTRATE 100 MG PO TABS: 100.0000 mg | ORAL_TABLET | ORAL | 0 refills | Status: DC

## 2020-10-24 NOTE — Telephone Encounter (Signed)
Patient calling  Patient is ready to schedule CTA - would like process to begin  Please call to discuss

## 2020-10-24 NOTE — Telephone Encounter (Signed)
Patient seen in clinic with dyspnea on exertion and precordial chest discomfort. We discussed cath vs cardiac CTA as he has known coronary disease without previous stenting.   Please order/send percert for cardiac CTA.  Most recent lab work 06/2020 with creatinine 0.9 and GFR 91.  His HR in clinic was 76bpm and he is 45 yo - anticipate he will require the Lopressor 149m prior to cardiac CTA.  Let me know if I can be of assistance. He uses MyChart frequently so likely can send directions through that after phone discussion.   CLoel Dubonnet NP

## 2020-10-24 NOTE — Telephone Encounter (Signed)
Spoke with patient and reviewed that we will place orders for the Cardiac CTA and advised I would send instructions to his My Chart. He was appreciative for the call back and review of testing. Instructed him to please let us know if he should have any further questions. He verbalized understanding with no further questions at this time.   Your cardiac CT will be scheduled at the below location:   [facility] [address] [place], [place] [address] [phone]   Please arrive 15 mins early for check-in and test prep.  Please follow these instructions carefully (unless otherwise directed):  Hold all erectile dysfunction medications at least 3 days (72 hrs) prior to test.  On the Night Before the Test: . Be sure to Drink plenty of water. . Do not consume any caffeinated/decaffeinated beverages or chocolate 12 hours prior to your test. . Do not take any antihistamines 12 hours prior to your test.   On the Day of the Test: . Drink plenty of water until 1 hour prior to the test. . Do not eat any food 4 hours prior to the test. . You may take your regular medications prior to the test.  . Take metoprolol (Lopressor) 100 mg two hours prior to test.       After the Test: . Drink plenty of water. . After receiving IV contrast, you may experience a mild flushed feeling. This is normal. . On occasion, you may experience a mild rash up to 24 hours after the test. This is not dangerous. If this occurs, you can take Benadryl 25 mg and increase your fluid intake. . If you experience trouble breathing, this can be serious. If it is severe call 911 IMMEDIATELY. If it is mild, please call our office. . If you take any of these medications: Glipizide/Metformin, Avandament, Glucavance, please do not take 48 hours after completing test unless otherwise instructed.   Once we have confirmed authorization from your insurance company, we will call you to  set up a date and time for your test. Based on how quickly your insurance processes prior authorizations requests, please allow up to 4 weeks to be contacted for scheduling your Cardiac CT appointment. Be advised that routine Cardiac CT appointments could be scheduled as many as 8 weeks after your provider has ordered it.  For non-scheduling related questions, please contact the cardiac imaging nurse navigator should you have any questions/concerns: Marchia Bond, Cardiac Imaging Nurse Navigator Gordy Clement, Cardiac Imaging Nurse Navigator [facility] Direct Office Dial: [phone]   For scheduling needs, including cancellations and rescheduling, please call [place], [phone].

## 2020-11-07 ENCOUNTER — Telehealth (HOSPITAL_COMMUNITY): Payer: Self-pay | Admitting: *Deleted

## 2020-11-07 NOTE — Telephone Encounter (Signed)
Reaching out to patient to offer assistance regarding upcoming cardiac imaging study; pt verbalizes understanding of appt date/time, parking situation and where to check in, pre-test NPO status and medications ordered, and verified current allergies; name and call back number provided for further questions should they arise  Gordy Clement RN Navigator Cardiac Imaging [facility] [phone] office [phone] cell  Pt to hold daily BP medications but will take 152m metoprolol tartrate 2 hours prior to CT scan.

## 2020-11-09 ENCOUNTER — Ambulatory Visit
Admission: RE | Admit: 2020-11-09 | Discharge: 2020-11-09 | Disposition: A | Discharge status: Home / Self Care | Payer: BC Managed Care – PPO | Source: Ambulatory Visit | Attending: Family | Admitting: Family

## 2020-11-09 ENCOUNTER — Other Ambulatory Visit: Payer: Self-pay

## 2020-11-09 ENCOUNTER — Telehealth: Payer: Self-pay | Admitting: Family

## 2020-11-09 DIAGNOSIS — R072 Precordial pain: Secondary | ICD-10-CM | POA: Diagnosis not present

## 2020-11-09 DIAGNOSIS — Z01818 Encounter for other preprocedural examination: Secondary | ICD-10-CM

## 2020-11-09 LAB — POCT I-STAT CREATININE: Creatinine, Ser: 0.8 mg/dL (ref 0.61–1.24)

## 2020-11-09 MED ORDER — NITROGLYCERIN 0.4 MG SL SUBL
0.8000 mg | SUBLINGUAL_TABLET | Freq: Once | SUBLINGUAL | Status: AC
  Administered 2020-11-09: 0.8 mg via SUBLINGUAL

## 2020-11-09 MED ORDER — IOHEXOL 350 MG/ML SOLN
75.0000 mL | Freq: Once | INTRAVENOUS | Status: AC | PRN
  Administered 2020-11-09: 75 mL via INTRAVENOUS

## 2020-11-09 NOTE — Progress Notes (Signed)
Patient tolerated procedure well. Ambulate w/o difficulty. Sitting in chair drinking water provided. Encouraged to drink extra water today and reasoning explained. Verbalized understanding. All questions answered. ABC intact. No further needs. Discharge from procedure area w/o issues.

## 2020-11-09 NOTE — Telephone Encounter (Signed)
Reviewed cardiac CTA results with Mr. Liotta. He was aware of hepatic steatosis and previous lung scarring from pneumonia and collapsed lung as a child.   Reviewed worsening coronary artery disease and indication for cardiac catheterization. He endorses continued exertional dyspnea. He prefers to have catheterization at [facility] early next week. Asks if Dr. Haroldine Laws could perform as he previously followed with him and discussed that he predominantly follows heart failure patients at this time.   Will contact him tomorrow regarding scheduling procedure and obtaining consent.   [name], NP

## 2020-11-09 NOTE — Telephone Encounter (Signed)
Attempted to call cardiac CTA result to Lance Terry. (Results detailed below)  Left VM to let him know I would call him in the morning to review. Will send detailed MyChart message as well.   [name], NP   ---  Cardiac CTA 11/09/20 Cardiac read: IMPRESSION: 1. Coronary calcium score of 150. This was 97th percentile for age and sex matched control.   2. Normal coronary origin with right dominance.   3. Non calcified plaque causing severe stenosis in the mid-distal LAD   4. CAD-RADS 4 Severe stenosis. (70-99%). Cardiac catheterization or CT FFR is recommended. Consider symptom-guided anti-ischemic pharmacotherapy as well as risk factor modification per guideline directed care.   Additional analysis with CT FFR will be submitted and reported separately.  Non-cardiac read: IMPRESSION: 1. Severe hepatic steatosis. 2. 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

## 2020-11-10 ENCOUNTER — Other Ambulatory Visit
Admission: RE | Admit: 2020-11-10 | Discharge: 2020-11-10 | Disposition: A | Discharge status: Home / Self Care | Payer: BC Managed Care – PPO | Source: Ambulatory Visit | Attending: Internal Medicine | Admitting: Internal Medicine

## 2020-11-10 ENCOUNTER — Encounter: Payer: Self-pay | Admitting: Family

## 2020-11-10 ENCOUNTER — Other Ambulatory Visit
Admission: RE | Admit: 2020-11-10 | Discharge: 2020-11-10 | Disposition: A | Discharge status: Home / Self Care | Payer: BC Managed Care – PPO | Source: Home / Self Care | Attending: Family | Admitting: Family

## 2020-11-10 DIAGNOSIS — Z01812 Encounter for preprocedural laboratory examination: Secondary | ICD-10-CM | POA: Insufficient documentation

## 2020-11-10 DIAGNOSIS — Z20822 Contact with and (suspected) exposure to covid-19: Secondary | ICD-10-CM | POA: Diagnosis not present

## 2020-11-10 DIAGNOSIS — I25118 Atherosclerotic heart disease of native coronary artery with other forms of angina pectoris: Secondary | ICD-10-CM

## 2020-11-10 DIAGNOSIS — Z01818 Encounter for other preprocedural examination: Secondary | ICD-10-CM

## 2020-11-10 LAB — CBC
HCT: 45.3 % (ref 39.0–52.0)
Hemoglobin: 15.4 g/dL (ref 13.0–17.0)
MCH: 30.6 pg (ref 26.0–34.0)
MCHC: 34 g/dL (ref 30.0–36.0)
MCV: 90.1 fL (ref 80.0–100.0)
Platelets: 265 10*3/uL (ref 150–400)
RBC: 5.03 MIL/uL (ref 4.22–5.81)
RDW: 12.1 % (ref 11.5–15.5)
WBC: 7.3 10*3/uL (ref 4.0–10.5)
nRBC: 0 % (ref 0.0–0.2)

## 2020-11-10 LAB — BASIC METABOLIC PANEL
Anion gap: 11 (ref 5–15)
BUN: 11 mg/dL (ref 6–20)
CO2: 22 mmol/L (ref 22–32)
Calcium: 9.7 mg/dL (ref 8.9–10.3)
Chloride: 107 mmol/L (ref 98–111)
Creatinine, Ser: 0.79 mg/dL (ref 0.61–1.24)
GFR, Estimated: >60 mL/min (ref >60)
Glucose, Bld: 101 mg/dL — ABNORMAL HIGH (ref 70–99)
Potassium: 3.9 mmol/L (ref 3.5–5.1)
Sodium: 140 mmol/L (ref 135–145)

## 2020-11-10 LAB — SARS CORONAVIRUS 2 (TAT 6-24 HRS): SARS Coronavirus 2: NEGATIVE

## 2020-11-10 MED ORDER — CLOPIDOGREL BISULFATE 75 MG PO TABS
ORAL_TABLET | ORAL | 2 refills | Status: DC
Start: 2020-11-10 — End: 2020-11-14

## 2020-11-10 NOTE — Telephone Encounter (Signed)
Spoke with Dr. Haroldine Laws. Cardiac catheterization is being scheduled for 11/13/20 at 0730. Dr. Haroldine Laws kindly taking care of cath orders.  For preoperative testing, Lance Terry will have BMP/CBC at the [facility] today and COVID testing at [facility] center before 1pm today.   Called Lance Terry to review instructions. He was agreeable and verbalized understanding.   [name], NP

## 2020-11-10 NOTE — Telephone Encounter (Signed)
Called patient to review instructions. Verbalized understanding of pre-catheterization instructions, arrival time, and Plavix dosing.   [name], NP

## 2020-11-10 NOTE — Addendum Note (Signed)
Addended by: [name] on: 11/10/2020 10:31 AM   Modules accepted: Orders

## 2020-11-10 NOTE — Telephone Encounter (Signed)
Spoke with Mr. Lance Terry this morning. Reviewed FFR showing significant stenosis. The risks [stroke (1 in 1000), death (1 in 71), kidney failure [usually temporary] (1 in 500), bleeding (1 in 200), allergic reaction [possibly serious] (1 in 200)], benefits (diagnostic support and management of coronary artery disease) and alternatives of a cardiac catheterization were discussed in detail with Mr. Lance Terry and he is willing to proceed.  Tells me he spoke with Dr. Haroldine Terry last night and is interested in pursuing cardiac catheterization with Dr. Ardyth Terry. Lance Terry. Will reach out to Dr. Haroldine Terry to facilitate.  Anticipate Mr. Lance Terry will need CBC/BMP/Covid testing at [place] at [facility] prior to cardiac cath.  Lance Dubonnet, NP

## 2020-11-10 NOTE — Telephone Encounter (Signed)
Covid testing site at [facility] notified pt to arrive today for Covid test. Pre procedure Covid test for cardiac catheterization at [facility] 11/13/20 with Dr. Haroldine Laws.

## 2020-11-13 ENCOUNTER — Ambulatory Visit (HOSPITAL_COMMUNITY)
Admission: RE | Disposition: A | Discharge status: Home / Self Care | Payer: Self-pay | Source: Ambulatory Visit | Attending: Internal Medicine

## 2020-11-13 ENCOUNTER — Ambulatory Visit (HOSPITAL_COMMUNITY)
Admission: RE | Admit: 2020-11-13 | Discharge: 2020-11-14 | Disposition: A | Discharge status: Home / Self Care | Payer: BC Managed Care – PPO | Source: Ambulatory Visit | Attending: Internal Medicine | Admitting: Internal Medicine

## 2020-11-13 ENCOUNTER — Other Ambulatory Visit: Payer: Self-pay

## 2020-11-13 DIAGNOSIS — Z880 Allergy status to penicillin: Secondary | ICD-10-CM | POA: Diagnosis not present

## 2020-11-13 DIAGNOSIS — Z79899 Other long term (current) drug therapy: Secondary | ICD-10-CM | POA: Diagnosis not present

## 2020-11-13 DIAGNOSIS — I209 Angina pectoris, unspecified: Secondary | ICD-10-CM | POA: Diagnosis present

## 2020-11-13 DIAGNOSIS — I25119 Atherosclerotic heart disease of native coronary artery with unspecified angina pectoris: Secondary | ICD-10-CM | POA: Diagnosis present

## 2020-11-13 DIAGNOSIS — Z886 Allergy status to analgesic agent status: Secondary | ICD-10-CM | POA: Insufficient documentation

## 2020-11-13 DIAGNOSIS — E785 Hyperlipidemia, unspecified: Secondary | ICD-10-CM | POA: Diagnosis not present

## 2020-11-13 DIAGNOSIS — Z7982 Long term (current) use of aspirin: Secondary | ICD-10-CM | POA: Insufficient documentation

## 2020-11-13 DIAGNOSIS — I10 Essential (primary) hypertension: Secondary | ICD-10-CM | POA: Diagnosis not present

## 2020-11-13 DIAGNOSIS — Z955 Presence of coronary angioplasty implant and graft: Secondary | ICD-10-CM | POA: Diagnosis not present

## 2020-11-13 DIAGNOSIS — Z7951 Long term (current) use of inhaled steroids: Secondary | ICD-10-CM | POA: Insufficient documentation

## 2020-11-13 HISTORY — PX: INTRAVASCULAR ULTRASOUND/IVUS: CATH118244

## 2020-11-13 HISTORY — PX: CORONARY STENT INTERVENTION: CATH118234

## 2020-11-13 HISTORY — PX: LEFT HEART CATH AND CORONARY ANGIOGRAPHY: CATH118249

## 2020-11-13 SURGERY — LEFT HEART CATH AND CORONARY ANGIOGRAPHY
Anesthesia: LOCAL

## 2020-11-13 MED ORDER — VERAPAMIL HCL 2.5 MG/ML IV SOLN
INTRAVENOUS | Status: DC | PRN
  Administered 2020-11-13: 10 mL via INTRA_ARTERIAL

## 2020-11-13 MED ORDER — SODIUM CHLORIDE 0.9 % WEIGHT BASED INFUSION
3.0000 mL/kg/h | INTRAVENOUS | Status: DC
  Administered 2020-11-13: 3 mL/kg/h via INTRAVENOUS

## 2020-11-13 MED ORDER — MIDAZOLAM HCL 2 MG/2ML IJ SOLN
INTRAMUSCULAR | Status: AC
  Filled 2020-11-13: qty 2

## 2020-11-13 MED ORDER — METOPROLOL SUCCINATE ER 25 MG PO TB24
25.0000 mg | ORAL_TABLET | Freq: Every day | ORAL | Status: DC
  Administered 2020-11-13: 25 mg via ORAL
  Filled 2020-11-13 (×2): qty 1

## 2020-11-13 MED ORDER — VERAPAMIL HCL 2.5 MG/ML IV SOLN
INTRAVENOUS | Status: AC
  Filled 2020-11-13: qty 2

## 2020-11-13 MED ORDER — SODIUM CHLORIDE 0.9 % WEIGHT BASED INFUSION: 1.0000 mL/kg/h | INTRAVENOUS | Status: DC

## 2020-11-13 MED ORDER — LIDOCAINE HCL (PF) 1 % IJ SOLN
INTRAMUSCULAR | Status: DC | PRN
  Administered 2020-11-13: 1 mL

## 2020-11-13 MED ORDER — MIDAZOLAM HCL 2 MG/2ML IJ SOLN
INTRAMUSCULAR | Status: DC | PRN
  Administered 2020-11-13 (×2): 1 mg via INTRAVENOUS
  Administered 2020-11-13: 2 mg via INTRAVENOUS

## 2020-11-13 MED ORDER — SODIUM CHLORIDE 0.9% FLUSH: 3.0000 mL | Freq: Two times a day (BID) | INTRAVENOUS | Status: DC

## 2020-11-13 MED ORDER — HEPARIN (PORCINE) IN NACL 1000-0.9 UT/500ML-% IV SOLN
INTRAVENOUS | Status: DC | PRN
  Administered 2020-11-13 (×2): 500 mL

## 2020-11-13 MED ORDER — FENTANYL CITRATE (PF) 100 MCG/2ML IJ SOLN
INTRAMUSCULAR | Status: DC | PRN
  Administered 2020-11-13: 25 ug via INTRAVENOUS

## 2020-11-13 MED ORDER — HEPARIN (PORCINE) IN NACL 1000-0.9 UT/500ML-% IV SOLN
INTRAVENOUS | Status: AC
  Filled 2020-11-13: qty 1000

## 2020-11-13 MED ORDER — ROSUVASTATIN CALCIUM 20 MG PO TABS
40.0000 mg | ORAL_TABLET | Freq: Every day | ORAL | Status: DC
  Administered 2020-11-13: 40 mg via ORAL
  Filled 2020-11-13 (×2): qty 2

## 2020-11-13 MED ORDER — CLOPIDOGREL BISULFATE 75 MG PO TABS
75.0000 mg | ORAL_TABLET | Freq: Every day | ORAL | Status: DC
  Filled 2020-11-13: qty 1

## 2020-11-13 MED ORDER — SODIUM CHLORIDE 0.9 % IV SOLN: 250.0000 mL | INTRAVENOUS | Status: DC | PRN

## 2020-11-13 MED ORDER — SODIUM CHLORIDE 0.9% FLUSH: 3.0000 mL | INTRAVENOUS | Status: DC | PRN

## 2020-11-13 MED ORDER — DIAZEPAM 5 MG PO TABS: 5.0000 mg | ORAL_TABLET | Freq: Four times a day (QID) | ORAL | Status: DC | PRN

## 2020-11-13 MED ORDER — NITROGLYCERIN 1 MG/10 ML FOR IR/CATH LAB
INTRA_ARTERIAL | Status: DC | PRN
  Administered 2020-11-13: 200 ug
  Administered 2020-11-13: 150 ug

## 2020-11-13 MED ORDER — NITROGLYCERIN 1 MG/10 ML FOR IR/CATH LAB
INTRA_ARTERIAL | Status: AC
  Filled 2020-11-13: qty 10

## 2020-11-13 MED ORDER — ADULT MULTIVITAMIN W/MINERALS CH
1.0000 | ORAL_TABLET | Freq: Every day | ORAL | Status: DC
  Filled 2020-11-13: qty 1

## 2020-11-13 MED ORDER — IOHEXOL 350 MG/ML SOLN
INTRAVENOUS | Status: DC | PRN
  Administered 2020-11-13: 100 mL

## 2020-11-13 MED ORDER — NITROGLYCERIN 0.4 MG SL SUBL: 0.4000 mg | SUBLINGUAL_TABLET | SUBLINGUAL | Status: DC | PRN

## 2020-11-13 MED ORDER — HYDRALAZINE HCL 20 MG/ML IJ SOLN: 10.0000 mg | INTRAMUSCULAR | Status: AC | PRN

## 2020-11-13 MED ORDER — LABETALOL HCL 5 MG/ML IV SOLN: 10.0000 mg | INTRAVENOUS | Status: AC | PRN

## 2020-11-13 MED ORDER — EZETIMIBE 10 MG PO TABS
10.0000 mg | ORAL_TABLET | Freq: Every day | ORAL | Status: DC
  Administered 2020-11-13: 10 mg via ORAL
  Filled 2020-11-13 (×2): qty 1

## 2020-11-13 MED ORDER — ACETAMINOPHEN 325 MG PO TABS
650.0000 mg | ORAL_TABLET | ORAL | Status: DC | PRN
  Administered 2020-11-13: 650 mg via ORAL
  Filled 2020-11-13: qty 2

## 2020-11-13 MED ORDER — ASPIRIN EC 81 MG PO TBEC
81.0000 mg | DELAYED_RELEASE_TABLET | Freq: Every day | ORAL | Status: DC
  Filled 2020-11-13: qty 1

## 2020-11-13 MED ORDER — LISINOPRIL 20 MG PO TABS
20.0000 mg | ORAL_TABLET | Freq: Every day | ORAL | Status: DC
  Administered 2020-11-13: 20 mg via ORAL
  Filled 2020-11-13 (×2): qty 1

## 2020-11-13 MED ORDER — OXYCODONE HCL 5 MG PO TABS: 5.0000 mg | ORAL_TABLET | ORAL | Status: DC | PRN

## 2020-11-13 MED ORDER — HEPARIN SODIUM (PORCINE) 1000 UNIT/ML IJ SOLN
INTRAMUSCULAR | Status: DC | PRN
  Administered 2020-11-13 (×2): 5000 [IU] via INTRAVENOUS

## 2020-11-13 MED ORDER — FENTANYL CITRATE (PF) 100 MCG/2ML IJ SOLN
INTRAMUSCULAR | Status: DC | PRN
  Administered 2020-11-13: 25 ug via INTRAVENOUS
  Administered 2020-11-13: 50 ug via INTRAVENOUS
  Administered 2020-11-13 (×2): 25 ug via INTRAVENOUS

## 2020-11-13 MED ORDER — SODIUM CHLORIDE 0.9 % WEIGHT BASED INFUSION
1.0000 mL/kg/h | INTRAVENOUS | Status: AC
  Administered 2020-11-13 (×2): 1 mL/kg/h via INTRAVENOUS

## 2020-11-13 MED ORDER — FENTANYL CITRATE (PF) 100 MCG/2ML IJ SOLN
INTRAMUSCULAR | Status: AC
  Filled 2020-11-13: qty 2

## 2020-11-13 MED ORDER — HEPARIN SODIUM (PORCINE) 1000 UNIT/ML IJ SOLN
INTRAMUSCULAR | Status: AC
  Filled 2020-11-13: qty 1

## 2020-11-13 MED ORDER — DIAZEPAM 5 MG PO TABS
2.5000 mg | ORAL_TABLET | Freq: Once | ORAL | Status: AC
  Administered 2020-11-13: 2.5 mg via ORAL
  Filled 2020-11-13: qty 1

## 2020-11-13 MED ORDER — MIDAZOLAM HCL 2 MG/2ML IJ SOLN
INTRAMUSCULAR | Status: DC | PRN
  Administered 2020-11-13: 2 mg via INTRAVENOUS

## 2020-11-13 MED ORDER — CLOPIDOGREL BISULFATE 75 MG PO TABS: 75.0000 mg | ORAL_TABLET | ORAL | Status: DC

## 2020-11-13 MED ORDER — HEPARIN (PORCINE) IN NACL 2000-0.9 UNIT/L-% IV SOLN
INTRAVENOUS | Status: AC
  Filled 2020-11-13: qty 1000

## 2020-11-13 MED ORDER — IOHEXOL 350 MG/ML SOLN
INTRAVENOUS | Status: DC | PRN
  Administered 2020-11-13: 75 mL via INTRA_ARTERIAL

## 2020-11-13 MED ORDER — ONDANSETRON HCL 4 MG/2ML IJ SOLN: 4.0000 mg | Freq: Four times a day (QID) | INTRAMUSCULAR | Status: DC | PRN

## 2020-11-13 MED ORDER — MORPHINE SULFATE (PF) 2 MG/ML IV SOLN: 2.0000 mg | INTRAVENOUS | Status: DC | PRN

## 2020-11-13 MED ORDER — ASPIRIN 81 MG PO CHEW: 81.0000 mg | CHEWABLE_TABLET | ORAL | Status: DC

## 2020-11-13 MED ORDER — LIDOCAINE HCL (PF) 1 % IJ SOLN
INTRAMUSCULAR | Status: AC
  Filled 2020-11-13: qty 30

## 2020-11-13 SURGICAL SUPPLY — 31 items
BALLN SAPPHIRE 2.5X15 (BALLOONS) ×4
BALLN SAPPHIRE ~~LOC~~ 3.75X8 (BALLOONS) ×2 IMPLANT
BALLN ~~LOC~~ EMERGE MR 3.0X20 (BALLOONS) ×2
BALLN ~~LOC~~ EMERGE MR 3.25X20 (BALLOONS) ×2
BALLOON SAPPHIRE 2.5X15 (BALLOONS) ×2 IMPLANT
BALLOON ~~LOC~~ EMERGE MR 3.0X20 (BALLOONS) ×1 IMPLANT
BALLOON ~~LOC~~ EMERGE MR 3.25X20 (BALLOONS) ×1 IMPLANT
CATH 5FR JL3.5 JR4 ANG PIG MP (CATHETERS) ×2 IMPLANT
CATH INFINITI 5 FR AL2 (CATHETERS) ×2 IMPLANT
CATH INFINITI 5FR AL1 (CATHETERS) ×2 IMPLANT
CATH LAUNCHER 5F JL3 (CATHETERS) ×1 IMPLANT
CATH LAUNCHER 6FR AL.75 (CATHETERS) ×2 IMPLANT
CATH LAUNCHER 6FR EBU 3 (CATHETERS) ×2 IMPLANT
CATH OPTICROSS HD (CATHETERS) ×2 IMPLANT
CATH VISTA GUIDE 6FR JL3 (CATHETERS) ×2 IMPLANT
CATHETER LAUNCHER 5F JL3 (CATHETERS) ×2
DEVICE RAD COMP TR BAND LRG (VASCULAR PRODUCTS) ×2 IMPLANT
GLIDESHEATH SLEND SS 6F .021 (SHEATH) ×2 IMPLANT
GUIDEWIRE INQWIRE 1.5J.035X260 (WIRE) ×1 IMPLANT
INQWIRE 1.5J .035X260CM (WIRE) ×2
KIT ENCORE 26 ADVANTAGE (KITS) ×2 IMPLANT
KIT HEART LEFT (KITS) ×2 IMPLANT
PACK CARDIAC CATHETERIZATION (CUSTOM PROCEDURE TRAY) ×2 IMPLANT
SHEATH PROBE COVER 6X72 (BAG) ×2 IMPLANT
SLED PULL BACK IVUS (MISCELLANEOUS) ×2 IMPLANT
STENT SYNERGY XD 2.75X38 (Permanent Stent) ×1 IMPLANT
SYNERGY XD 2.75X38 (Permanent Stent) ×2 IMPLANT
TRANSDUCER W/STOPCOCK (MISCELLANEOUS) ×2 IMPLANT
TUBING CIL FLEX 10 FLL-RA (TUBING) ×2 IMPLANT
WIRE COUGAR XT STRL 190CM (WIRE) ×2 IMPLANT
WIRE HI TORQ WHISPER MS 190CM (WIRE) ×2 IMPLANT

## 2020-11-13 NOTE — Plan of Care (Signed)
  Problem: Cardiovascular: Goal: Vascular access site(s) Level 0-1 will be maintained Outcome: Completed/Met

## 2020-11-13 NOTE — Progress Notes (Signed)
Patient transferred from cath lab at 1450hrs. Right radial site, level zero.  Patient given post cath instructions and oriented to unit.  Patient verbalized understanding.

## 2020-11-13 NOTE — H&P (Signed)
Advanced Heart Failure Team History and Physical Note   PCP:  Adin Hector, MD  PCP-Cardiology: Ida Rogue, MD     Reason for Admission: CAD s/p LAD PCI    HPI:    Lance Terry is a 45 y.o. male with a hx of HTN, HLD, CAD with NSTEMI 01/2009 in setting of heat stroke with cath showing mid LAD lesion 50-60% with medical management last seen 05/2020 by Dr. Rockey Situ.  His CAD is to be NSTEMI 01/2009 in the setting of heat stroke.  Catheterization at that time showed mid LAD lesion 50-60% stenosed.  Subsequent ETT/Myoview 02/2009 with EF 71%, exercised for 12 minutes on Bruce protocol, no evidence of ischemia.  He had repeat exercise tolerance test 10/2012 at which time he exercised for 11 minutes of Bruce protocol, peak heart rate 181, no ischemia.  A cardiac CTA in 2016 showed mild proximal LAD disease with 50% mid LAD disease.  When last seen 05/2020 he noted chest discomfort in context of URI. Subsequent lexiscan myoview 06/2020 was low risk with no evidence of ischemia though EF measured at 41% likely due to GI uptake artifact. Subsequent echocardiogram 07/25/20 LVEF 55-60%, no RWMA, indeterminite diastolic parameters, trivial MR.  Has continues to have exertional dyspnea and CP. Cardiac CT with high-grade LAD lesion. Brought in today for cath  Cath with high-grade (99%) lesion in mLAD at bifurcation with large diagonal.   Underwent PCI with DES to LAD by Dr. Burt Knack    Review of Systems: [y] = yes, [ ]  = no   . General: Weight gain [ ] ; Weight loss [ ] ; Anorexia [ ] ; Fatigue Blue.Reese ]; Fever [ ] ; Chills [ ] ; Weakness [ ]   . Cardiac: Chest pain/pressure [ y]; Resting SOB [ ] ; Exertional SOB [ y]; Orthopnea [ ] ; Pedal Edema [ ] ; Palpitations [ ] ; Syncope [ ] ; Presyncope [ ] ; Paroxysmal nocturnal dyspnea[ ]   . Pulmonary: Cough [ ] ; Wheezing[ ] ; Hemoptysis[ ] ; Sputum [ ] ; Snoring [ ]   . GI: Vomiting[ ] ; Dysphagia[ ] ; Melena[ ] ; Hematochezia [ ] ; Heartburn[ ] ; Abdominal pain [ ] ;  Constipation [ ] ; Diarrhea [ ] ; BRBPR [ ]   . GU: Hematuria[ ] ; Dysuria [ ] ; Nocturia[ ]   . Vascular: Pain in legs with walking [ ] ; Pain in feet with lying flat [ ] ; Non-healing sores [ ] ; Stroke [ ] ; TIA [ ] ; Slurred speech [ ] ;  . Neuro: Headaches[ ] ; Vertigo[ ] ; Seizures[ ] ; Paresthesias[ ] ;Blurred vision [ ] ; Diplopia [ ] ; Vision changes [ ]   . Ortho/Skin: Arthritis [ y]; Joint pain Blue.Reese ]; Muscle pain [ ] ; Joint swelling [ ] ; Back Pain [ ] ; Rash [ ]   . Psych: Depression[ ] ; Anxiety[ ]   . Heme: Bleeding problems [ ] ; Clotting disorders [ ] ; Anemia [ ]   . Endocrine: Diabetes [ ] ; Thyroid dysfunction[ ]    Home Medications Prior to Admission medications   Medication Sig Start Date End Date Taking? Authorizing Provider  aspirin 81 MG tablet Take 81 mg by mouth daily.   Yes [provider]  Cholecalciferol (VITAMIN D) 50 MCG (2000 UT) tablet Take 4,000-6,000 Units by mouth daily.   Yes [provider]  clopidogrel (PLAVIX) 75 MG tablet Take 4 tablets (321m) for one dose. Then take 1 tablet (734m daily. 11/10/20  Yes WaLoel DubonnetNP  ezetimibe (ZETIA) 10 MG tablet Take 1 tablet (10 mg total) by mouth daily. 05/02/20  Yes GoMinna MerrittsMD  ibuprofen (  ADVIL) 200 MG tablet Take 400 mg by mouth every 6 (six) hours as needed for moderate pain.   Yes [provider]  lisinopril (ZESTRIL) 20 MG tablet TAKE 1 TABLET (20 MG TOTAL) BY MOUTH DAILY. Patient taking differently: Take 20 mg by mouth daily. 05/02/20  Yes Minna Merritts, MD  metoprolol succinate (TOPROL-XL) 25 MG 24 hr tablet Take 1 tablet (25 mg total) by mouth daily. 06/20/20  Yes Minna Merritts, MD  Multiple Vitamins-Minerals (MULTIVITAL) tablet Take 1 tablet by mouth daily.   Yes [provider]  naproxen sodium (ALEVE) 220 MG tablet Take 220 mg by mouth daily as needed (muscle pain).   Yes [provider]  nitroGLYCERIN (NITROSTAT) 0.4 MG SL tablet Place 1 tablet (0.4 mg total)  under the tongue every 5 (five) minutes as needed for chest pain. 10/20/20 01/18/21 Yes [name], NP  Omega-3 Fatty Acids (FISH OIL) 1200 MG CAPS Take 1,200 mg by mouth daily.   Yes [provider]  rosuvastatin (CRESTOR) 40 MG tablet Take 1 tablet (40 mg total) by mouth daily. 05/03/20  Yes Minna Merritts, MD  metoprolol tartrate (LOPRESSOR) 100 MG tablet Take 1 tablet (100 mg total) by mouth as directed. Take 1 tablet 2 hours prior to test. Patient not taking: No sig reported 10/24/20   [name], NP    Past Medical History: Past Medical History:  Diagnosis Date  . Coronary artery disease    -small NSTEMI in setting of heat stroke 7/10 -Cath 50-60% LAD; 7/10 -ET.[name]: EF 71% no ischemia 7/10   . Hyperlipidemia    mixed  . Hypertension     Past Surgical History: Past Surgical History:  Procedure Laterality Date  . SHOULDER SURGERY  2003    Family History:  Family History  Problem Relation Age of Onset  . Coronary artery disease Father        premature  . Heart attack Father 62  . Heart attack Paternal Grandfather   . Coronary artery disease Paternal Grandfather   . Coronary artery disease Other     Social History: Social History   Socioeconomic History  . Marital status: Married    Spouse name: Not on file  . Number of children: Not on file  . Years of education: Not on file  . Highest education level: Not on file  Occupational History  . Not on file  Tobacco Use  . Smoking status: Former Research scientist (life sciences)  . Smokeless tobacco: Never Used  Vaping Use  . Vaping Use: Never used  Substance and Sexual Activity  . Alcohol use: Yes    Comment: Occasional   . Drug use: No  . Sexual activity: Not on file  Other Topics Concern  . Not on file  Social History Narrative   Regularly exercises. Full time.    Social Determinants of Health   Financial Resource Strain: Not on file  Food Insecurity: Not on file  Transportation Needs: Not on file   Physical Activity: Not on file  Stress: Not on file  Social Connections: Not on file    Allergies:  Allergies  Allergen Reactions  . Penicillins Rash    Objective:    Vital Signs:   Temp:  [97.9 F (36.6 C)] 97.9 F (36.6 C) (04/25 0601) Pulse Rate:  [83] 83 (04/25 0601) BP: (143)/(109) 143/109 (04/25 0601) SpO2:  [99 %] 99 % (04/25 0601) Weight:  [96.2 kg] 96.2 kg (04/25 0601)   Filed Weights  11/13/20 0601  Weight: 96.2 kg     Physical Exam     General:  Well appearing. No respiratory difficulty HEENT: Normal Neck: Supple. no JVD. Carotids 2+ bilat; no bruits. No lymphadenopathy or thyromegaly appreciated. Cor: PMI nondisplaced. Regular rate & rhythm. No rubs, gallops or murmurs. Lungs: Clear Abdomen: Soft, nontender, nondistended. No hepatosplenomegaly. No bruits or masses. Good bowel sounds. Extremities: No cyanosis, clubbing, rash, edema Neuro: Alert & oriented x 3, cranial nerves grossly intact. moves all 4 extremities w/o difficulty. Affect pleasant.   Telemetry   Sinus 70-80s Personally reviewed  EKG   pending  Labs     Basic Metabolic Panel: Recent Labs  Lab 11/09/20 1013 11/10/20 1241  NA  --  140  K  --  3.9  CL  --  107  CO2  --  22  GLUCOSE  --  101*  BUN  --  11  CREATININE 0.80 0.79  CALCIUM  --  9.7    Liver Function Tests: No results for input(s): AST, ALT, ALKPHOS, BILITOT, PROT, ALBUMIN in the last 168 hours. No results for input(s): LIPASE, AMYLASE in the last 168 hours. No results for input(s): AMMONIA in the last 168 hours.  CBC: Recent Labs  Lab 11/10/20 1241  WBC 7.3  HGB 15.4  HCT 45.3  MCV 90.1  PLT 265    Cardiac Enzymes: No results for input(s): CKTOTAL, CKMB, CKMBINDEX, TROPONINI in the last 168 hours.  BNP: BNP (last 3 results) No results for input(s): BNP in the last 8760 hours.  ProBNP (last 3 results) No results for input(s): PROBNP in the last 8760 hours.   CBG: No results for input(s):  GLUCAP in the last 168 hours.  Coagulation Studies: No results for input(s): LABPROT, INR in the last 72 hours.  Imaging: CARDIAC CATHETERIZATION  Result Date: 11/13/2020  Mid LAD lesion is 99% stenosed.  Ao = 141/87 (113) LV = 145/19 Severe single vessel CAD with 99% mida LAD lesion at bifurcation of large diagonal. Plan PCI of LAD with Dr. Burt Knack. Glori Bickers, MD 9:27 AM     Assessment/Plan   1. CAD  - high grade 99% mLAD lesion at bifurcation with large diagonal s/p PCI & DES today - will admit for post PCI observation - continue DAPT & statin - check lipids   2. HTN - adjust BP meds as needed  3. HL - goal LDL < 70 - continue Crestor 40  - check FLP  Glori Bickers, MD 11/13/2020, 10:17 AM  Advanced Heart Failure Team Pager [phone] (M-F; 7a - 5p)  Please contact [facility] for night-coverage after hours (4p -7a ) and weekends on [email or web address]

## 2020-11-13 NOTE — Plan of Care (Signed)
  Problem: Cardiovascular: Goal: Vascular access site(s) Level 0-1 will be maintained Outcome: Completed/Met   Problem: Clinical Measurements: Goal: Ability to maintain clinical measurements within normal limits will improve Outcome: Completed/Met   Problem: Elimination: Goal: Will not experience complications related to bowel motility Outcome: Completed/Met Goal: Will not experience complications related to urinary retention Outcome: Completed/Met   Problem: Clinical Measurements: Goal: Respiratory complications will improve Outcome: Not Applicable

## 2020-11-13 NOTE — Interval H&P Note (Signed)
History and Physical Interval Note:  11/13/2020 8:13 AM  Lance Terry  has presented today for surgery, with the diagnosis of chest pain.  The various methods of treatment have been discussed with the patient and family. After consideration of risks, benefits and other options for treatment, the patient has consented to  Procedure(s): LEFT HEART CATH AND CORONARY ANGIOGRAPHY (N/A) and possible coronary angioplasty as a surgical intervention.  The patient's history has been reviewed, patient examined, no change in status, stable for surgery.  I have reviewed the patient's chart and labs.  Questions were answered to the patient's satisfaction.     [name]

## 2020-11-14 ENCOUNTER — Encounter (HOSPITAL_COMMUNITY): Payer: Self-pay | Admitting: Internal Medicine

## 2020-11-14 DIAGNOSIS — Z880 Allergy status to penicillin: Secondary | ICD-10-CM | POA: Diagnosis not present

## 2020-11-14 DIAGNOSIS — E785 Hyperlipidemia, unspecified: Secondary | ICD-10-CM | POA: Diagnosis not present

## 2020-11-14 DIAGNOSIS — Z7951 Long term (current) use of inhaled steroids: Secondary | ICD-10-CM | POA: Diagnosis not present

## 2020-11-14 DIAGNOSIS — I209 Angina pectoris, unspecified: Secondary | ICD-10-CM | POA: Diagnosis not present

## 2020-11-14 DIAGNOSIS — I10 Essential (primary) hypertension: Secondary | ICD-10-CM | POA: Diagnosis not present

## 2020-11-14 DIAGNOSIS — Z79899 Other long term (current) drug therapy: Secondary | ICD-10-CM | POA: Diagnosis not present

## 2020-11-14 DIAGNOSIS — Z7982 Long term (current) use of aspirin: Secondary | ICD-10-CM | POA: Diagnosis not present

## 2020-11-14 DIAGNOSIS — Z886 Allergy status to analgesic agent status: Secondary | ICD-10-CM | POA: Diagnosis not present

## 2020-11-14 DIAGNOSIS — Z955 Presence of coronary angioplasty implant and graft: Secondary | ICD-10-CM | POA: Diagnosis not present

## 2020-11-14 DIAGNOSIS — I25119 Atherosclerotic heart disease of native coronary artery with unspecified angina pectoris: Secondary | ICD-10-CM | POA: Diagnosis not present

## 2020-11-14 LAB — CBC
HCT: 42.9 % (ref 39.0–52.0)
Hemoglobin: 14 g/dL (ref 13.0–17.0)
MCH: 30.6 pg (ref 26.0–34.0)
MCHC: 32.6 g/dL (ref 30.0–36.0)
MCV: 93.9 fL (ref 80.0–100.0)
Platelets: 223 10*3/uL (ref 150–400)
RBC: 4.57 MIL/uL (ref 4.22–5.81)
RDW: 12.2 % (ref 11.5–15.5)
WBC: 6.8 10*3/uL (ref 4.0–10.5)
nRBC: 0 % (ref 0.0–0.2)

## 2020-11-14 LAB — BASIC METABOLIC PANEL
Anion gap: 7 (ref 5–15)
BUN: 13 mg/dL (ref 6–20)
CO2: 26 mmol/L (ref 22–32)
Calcium: 9.1 mg/dL (ref 8.9–10.3)
Chloride: 103 mmol/L (ref 98–111)
Creatinine, Ser: 0.98 mg/dL (ref 0.61–1.24)
GFR, Estimated: >60 mL/min (ref >60)
Glucose, Bld: 99 mg/dL (ref 70–99)
Potassium: 4.1 mmol/L (ref 3.5–5.1)
Sodium: 136 mmol/L (ref 135–145)

## 2020-11-14 LAB — LIPID PANEL
Cholesterol: 96 mg/dL (ref 0–200)
HDL: 30 mg/dL — ABNORMAL LOW (ref >40)
LDL Cholesterol: 23 mg/dL (ref 0–99)
Total CHOL/HDL Ratio: 3.2 RATIO
Triglycerides: 217 mg/dL — ABNORMAL HIGH (ref <150)
VLDL: 43 mg/dL — ABNORMAL HIGH (ref 0–40)

## 2020-11-14 LAB — HEMOGLOBIN A1C
Hgb A1c MFr Bld: 5.5 % (ref 4.8–5.6)
Mean Plasma Glucose: 111.15 mg/dL

## 2020-11-14 LAB — POCT ACTIVATED CLOTTING TIME: Activated Clotting Time: 297 seconds

## 2020-11-14 MED ORDER — ICOSAPENT ETHYL 1 G PO CAPS: 2.0000 g | ORAL_CAPSULE | Freq: Two times a day (BID) | ORAL | 6 refills | Status: DC

## 2020-11-14 MED ORDER — CLOPIDOGREL BISULFATE 75 MG PO TABS: 75.0000 mg | ORAL_TABLET | Freq: Every day | ORAL | 6 refills | Status: DC

## 2020-11-14 MED FILL — Heparin Sod (Porcine)-NaCl IV Soln 1000 Unit/500ML-0.9%: INTRAVENOUS | Qty: 500 | Status: AC

## 2020-11-14 NOTE — Discharge Instructions (Addendum)
Coronary Artery Disease, Male Coronary artery disease (CAD) is a condition in which the arteries that lead to the heart (coronary arteries) become narrow or blocked. The narrowing or blockage can lead to decreased blood flow to the heart. Prolonged reduced blood flow can cause a heart attack (myocardial infarction or MI). This condition may also be called coronary heart disease. Because CAD is the leading cause of death in men, it is important to understand what causes this condition and how it is treated. What are the causes? CAD is most often caused by atherosclerosis. This is the buildup of fat and cholesterol (plaque) on the inside of the arteries. Over time, the plaque may narrow or block the artery, reducing blood flow to the heart. Plaque can also become weak and break off within a coronary artery and cause a sudden blockage. Other less common causes of CAD include:  A blood clot or a piece of a blood clot or other substance that blocks the flow of blood in a coronary artery (embolism).  A tearing of the artery (spontaneous coronary artery dissection).  An enlargement of an artery (aneurysm).  Inflammation (vasculitis) in the artery wall.   What increases the risk? The following factors may make you more likely to develop this condition:  Age. Men over age 56 are at a greater risk of CAD.  Family history of CAD.  Gender. Men often develop CAD earlier in life than women.  High blood pressure (hypertension).  Diabetes.  High cholesterol levels.  Tobacco use.  Excessive alcohol use.  Lack of exercise.  A diet high in saturated and trans fats, such as fried food and processed meat. Other possible risk factors include:  High stress levels.  Depression.  Obesity.  Sleep apnea. What are the signs or symptoms? Many people do not have any symptoms during the early stages of CAD. As the condition progresses, symptoms may include:  Chest pain (angina). The pain can: ? Feel  like crushing or squeezing, or like a tightness, pressure, fullness, or heaviness in the chest. ? Last more than a few minutes or can stop and recur. The pain tends to get worse with exercise or stress and to fade with rest.  Pain in the arms, neck, jaw, ear, or back.  Unexplained heartburn or indigestion.  Shortness of breath.  Nausea or vomiting.  Sudden light-headedness.  Sudden cold sweats.  Fluttering or fast heartbeat (palpitations). How is this diagnosed? This condition is diagnosed based on:  Your family and medical history.  A physical exam.  Tests, including: ? A test to check the electrical signals in your heart (electrocardiogram). ? Exercise stress test. This looks for signs of blockage when the heart is stressed with exercise, such as running on a treadmill. ? Pharmacologic stress test. This test looks for signs of blockage when the heart is being stressed with a medicine. ? Blood tests. ? Coronary angiogram. This is a procedure to look at the coronary arteries to see if there is any blockage. During this test, a dye is injected into your arteries so they appear on an X-ray. ? Coronary artery CT scan. This CT scan helps detect calcium deposits in your coronary arteries. Calcium deposits are an indicator of CAD. ? A test that uses sound waves to take a picture of your heart (echocardiogram). ? Chest X-ray. How is this treated? This condition may be treated by:  Healthy lifestyle changes to reduce risk factors.  Medicines such as: ? Antiplatelet medicines and blood-thinning  medicines, such as aspirin. These help to prevent blood clots. ? Nitroglycerin. ? Blood pressure medicines. ? Cholesterol-lowering medicine.  Coronary angioplasty and stenting. During this procedure, a thin, flexible tube is inserted through a blood vessel and into a blocked artery. A balloon or similar device on the end of the tube is inflated to open up the artery. In some cases, a small,  mesh tube (stent) is inserted into the artery to keep it open.  Coronary artery bypass surgery. During this surgery, veins or arteries from other parts of the body are used to create a bypass around the blockage and allow blood to reach your heart. Follow these instructions at home: Medicines  Take over-the-counter and prescription medicines only as told by your health care provider.  Do not take the following medicines unless your health care provider approves: ? NSAIDs, such as ibuprofen, naproxen, or celecoxib. ? Vitamin supplements that contain vitamin A, vitamin E, or both. Lifestyle  Follow an exercise program approved by your health care provider. Aim for 150 minutes of moderate exercise or 75 minutes of vigorous exercise each week.  Maintain a healthy weight or lose weight as approved by your health care provider.  Learn to manage stress or try to limit your stress. Ask your health care provider for suggestions if you need help.  Get screened for depression and seek treatment, if needed.  Do not use any products that contain nicotine or tobacco, such as cigarettes, e-cigarettes, and chewing tobacco. If you need help quitting, ask your health care provider.  Do not use illegal drugs. Eating and drinking  Follow a heart-healthy diet. A dietitian can help educate you about healthy food options and changes. In general, eat plenty of fruits and vegetables, lean meats, and whole grains.  Avoid foods high in: ? Sugar. ? Salt (sodium). ? Saturated fat, such as processed or fatty meat. ? Trans fat, such as fried foods.  Use healthy cooking methods such as roasting, grilling, broiling, baking, poaching, steaming, or stir-frying.  Do not drink alcohol if your health care provider tells you not to drink.  If you drink alcohol: ? Limit how much you have to 0-2 drinks per day. ? Be aware of how much alcohol is in your drink. In the [place] one drink equals one 12 oz bottle of beer  (355 mL), one 5 oz glass of wine (148 mL), or one 1 oz glass of hard liquor (44 mL).   General instructions  Manage any other health conditions, such as hypertension and diabetes. These conditions affect your heart.  Your health care provider may ask you to monitor your blood pressure. Ideally, your blood pressure should be below 130/80.  Keep all follow-up visits as told by your health care provider. This is important. Get help right away if:  You have pain in your chest, neck, ear, arm, jaw, stomach, or back that: ? Lasts more than a few minutes. ? Is recurring. ? Is not relieved by taking medicine under your tongue (sublingual nitroglycerin).  You have profuse sweating without cause.  You have unexplained: ? Heartburn or indigestion. ? Shortness of breath or difficulty breathing. ? Fluttering or fast heartbeat (palpitations). ? Nausea or vomiting. ? Fatigue. ? Feelings of nervousness or anxiety. ? Weakness. ? Diarrhea.  You have sudden light-headedness or dizziness.  You faint.  You feel like hurting yourself or think about taking your own life. These symptoms may represent a serious problem that is an emergency. Do not wait to  see if the symptoms will go away. Get medical help right away. Call your local emergency services (911 in the [place] Do not drive yourself to the hospital. Summary  Coronary artery disease (CAD) is a condition in which the arteries that lead to the heart (coronary arteries) become narrow or blocked. The narrowing or blockage can lead to a heart attack.  Many people do not have any symptoms during the early stages of CAD.  CAD can be treated with lifestyle changes, medicines, surgery, or a combination of these treatments. This information is not intended to replace advice given to you by your health care provider. Make sure you discuss any questions you have with your health care provider. Document Revised: 03/27/2018 Document Reviewed:  03/17/2018 Elsevier Patient Education  2021 [place]  This sheet gives you information about how to care for yourself after your procedure. Your health care provider may also give you more specific instructions. If you have problems or questions, contact your health care provider. What can I expect after the procedure? After the procedure, it is common to have:  Bruising and tenderness at the catheter insertion area. Follow these instructions at home: Medicines  Take over-the-counter and prescription medicines only as told by your health care provider. Insertion site care  Follow instructions from your health care provider about how to take care of your insertion site. Make sure you: ? Wash your hands with soap and water before you change your bandage (dressing). If soap and water are not available, use hand sanitizer. ? Change your dressing as told by your health care provider. ? Leave stitches (sutures), skin glue, or adhesive strips in place. These skin closures may need to stay in place for 2 weeks or longer. If adhesive strip edges start to loosen and curl up, you may trim the loose edges. Do not remove adhesive strips completely unless your health care provider tells you to do that.  Check your insertion site every day for signs of infection. Check for: ? Redness, swelling, or pain. ? Fluid or blood. ? Pus or a bad smell. ? Warmth.  Do not take baths, swim, or use a hot tub until your health care provider approves.  You may shower 24-48 hours after the procedure, or as directed by your health care provider. ? Remove the dressing and gently wash the site with plain soap and water. ? Pat the area dry with a clean towel. ? Do not rub the site. That could cause bleeding.  Do not apply powder or lotion to the site. Activity  For 24 hours after the procedure, or as directed by your health care provider: ? Do not flex or bend the affected arm. ? Do not push  or pull heavy objects with the affected arm. ? Do not drive yourself home from the hospital or clinic. You may drive 24 hours after the procedure unless your health care provider tells you not to. ? Do not operate machinery or power tools.  Do not lift anything that is heavier than 10 lb (4.5 kg), or the limit that you are told, until your health care provider says that it is safe.  Ask your health care provider when it is okay to: ? Return to work or school. ? Resume usual physical activities or sports. ? Resume sexual activity.   General instructions  If the catheter site starts to bleed, raise your arm and put firm pressure on the site. If the bleeding does not  stop, get help right away. This is a medical emergency.  If you went home on the same day as your procedure, a responsible adult should be with you for the first 24 hours after you arrive home.  Keep all follow-up visits as told by your health care provider. This is important. Contact a health care provider if:  You have a fever.  You have redness, swelling, or yellow drainage around your insertion site. Get help right away if:  You have unusual pain at the radial site.  The catheter insertion area swells very fast.  The insertion area is bleeding, and the bleeding does not stop when you hold steady pressure on the area.  Your arm or hand becomes pale, cool, tingly, or numb. These symptoms may represent a serious problem that is an emergency. Do not wait to see if the symptoms will go away. Get medical help right away. Call your local emergency services (911 in the [place] Do not drive yourself to the hospital. Summary  After the procedure, it is common to have bruising and tenderness at the site.  Follow instructions from your health care provider about how to take care of your radial site wound. Check the wound every day for signs of infection.  Do not lift anything that is heavier than 10 lb (4.5 kg), or the limit  that you are told, until your health care provider says that it is safe. This information is not intended to replace advice given to you by your health care provider. Make sure you discuss any questions you have with your health care provider. Document Revised: 08/13/2017 Document Reviewed: 08/13/2017 Elsevier Patient Education  2021 [employer].

## 2020-11-14 NOTE — Progress Notes (Signed)
Advanced Heart Failure Rounding Note   Subjective:    Underwent PCI pf LAD yesterday. Feels great. CP resolved.   Labs stable.     Objective:   Weight Range:  Vital Signs:   Temp:  [97.6 F (36.4 C)-98.2 F (36.8 C)] 98.2 F (36.8 C) (04/26 0502) Pulse Rate:  [66-93] 66 (04/26 0502) Resp:  [4-24] 18 (04/26 0502) BP: (106-170)/(72-114) 106/74 (04/26 0502) SpO2:  [94 %-100 %] 96 % (04/26 0502) Weight:  [96.3 kg] 96.3 kg (04/26 0502)    Weight change: Filed Weights   11/13/20 0601 11/14/20 0502  Weight: 96.2 kg 96.3 kg    Intake/Output:   Intake/Output Summary (Last 24 hours) at 11/14/2020 0739 Last data filed at 11/13/2020 1300 Gross per 24 hour  Intake 0 ml  Output --  Net 0 ml     Physical Exam: General:  Well appearing. No resp difficulty HEENT: normal Neck: supple. JVP flat. Carotids 2+ bilat; no bruits. No lymphadenopathy or thryomegaly appreciated. Cor: PMI nondisplaced. Regular rate & rhythm. No rubs, gallops or murmurs. Lungs: clear Abdomen: soft, nontender, nondistended. No hepatosplenomegaly. No bruits or masses. Good bowel sounds. Extremities: no cyanosis, clubbing, rash, edema  Cath site ok. No bruit  Neuro: alert & orientedx3, cranial nerves grossly intact. moves all 4 extremities w/o difficulty. Affect pleasant  Telemetry: Sinus 66  Labs: Basic Metabolic Panel: Recent Labs  Lab 11/09/20 1013 11/10/20 1241 11/14/20 0243  NA  --  140 136  K  --  3.9 4.1  CL  --  107 103  CO2  --  22 26  GLUCOSE  --  101* 99  BUN  --  11 13  CREATININE 0.80 0.79 0.98  CALCIUM  --  9.7 9.1    Liver Function Tests: No results for input(s): AST, ALT, ALKPHOS, BILITOT, PROT, ALBUMIN in the last 168 hours. No results for input(s): LIPASE, AMYLASE in the last 168 hours. No results for input(s): AMMONIA in the last 168 hours.  CBC: Recent Labs  Lab 11/10/20 1241 11/14/20 0243  WBC 7.3 6.8  HGB 15.4 14.0  HCT 45.3 42.9  MCV 90.1 93.9  PLT 265  223    Cardiac Enzymes: No results for input(s): CKTOTAL, CKMB, CKMBINDEX, TROPONINI in the last 168 hours.  BNP: BNP (last 3 results) No results for input(s): BNP in the last 8760 hours.  ProBNP (last 3 results) No results for input(s): PROBNP in the last 8760 hours.    Other results:  Imaging: CARDIAC CATHETERIZATION  Result Date: 11/13/2020  A drug-eluting stent was successfully placed using a SYNERGY XD 2.75X38.  Successful PTCA and stenting of critical stenosis in the mid LAD/second diagonal bifurcation ([place] 1, 1, 1 lesion).  LAD lesion treated with a 2.75 x 38 mm Synergy DES, postdilated under intravascular ultrasound guidance with a 3.0 mm noncompliant balloon distally and a 3.5 mm noncompliant balloon proximally.  Diagonal lesion treated provisionally through the stent struts with a 2.5 mm balloon with 20% residual stenosis.  TIMI-3 flow is present in both vessels post PCI.  Intravascular ultrasound demonstrates wide stent patency, good stent expansion, and good proximal and distal stent transition zones. Recommend: Overnight observation secondary to lesion complexity, DAPT with aspirin and clopidogrel x 12 months without interruption.   CARDIAC CATHETERIZATION  Result Date: 11/13/2020  Mid LAD lesion is 99% stenosed.  RPDA lesion is 100% stenosed.  Findings: Ao = 141/87 (113) LV = 145/19 1. LM ok 2. LAD 99% mid LAD  lesion at bifurcation of large diagonal. 3. LCk ok 4. RCA small PDA occluded at ostium with L to R collaterals Plan PCI of LAD with Dr. Burt Knack. Glori Bickers, MD 9:27 AM      Medications:     Scheduled Medications: . aspirin EC  81 mg Oral Daily  . clopidogrel  75 mg Oral Daily  . ezetimibe  10 mg Oral Daily  . lisinopril  20 mg Oral Daily  . metoprolol succinate  25 mg Oral Daily  . multivitamin with minerals  1 tablet Oral Daily  . rosuvastatin  40 mg Oral Daily  . sodium chloride flush  3 mL Intravenous Q12H     Infusions: . sodium chloride        PRN Medications:  sodium chloride, acetaminophen, diazepam, morphine injection, nitroGLYCERIN, ondansetron (ZOFRAN) IV, oxyCODONE, sodium chloride flush   Assessment/Plan:   1. CAD  - high grade 99% mLAD lesion at bifurcation with large diagonal s/p PCI & DES 4/25 - continue DAPT & statin - refer CR  2. HTN - Blood pressure well controlled. Continue current regimen.  3. HL - goal LDL < 70 - continue Crestor 40 & Zetia - LDL 23  - continue to follow   Home today.    Length of Stay: 0   Glori Bickers MD 11/14/2020, 7:39 AM  Advanced Heart Failure Team Pager [phone] (M-F; [place])  Please contact [facility] for night-coverage after hours (4p -7a ) and weekends on [email or web address]

## 2020-11-14 NOTE — Discharge Summary (Addendum)
Advanced Heart Failure Team  Discharge Summary   Patient ID: Lance Terry MRN: [id number], DOB/AGE: 46-Jul-1977 46 y.o. Admit date: 11/13/2020 D/C date:     11/14/2020   Primary Discharge Diagnoses:  CAD HTN Hyperlipidemia   Hospital Course:    Lance Terry is a 45 y.o. male with a hx of HTN, HLD, CAD with NSTEMI 01/2009. A cardiac CTA in 2016 showed mild proximal LAD disease with 50% mid LAD disease.   Had lexiscan myoview 06/2020 was low risk with no evidence of ischemia though EF measured at 41% likely due to GI uptake artifact. Subsequent echocardiogram 07/25/20 LVEF 55-60%, no RWMA, indeterminite diastolic parameters, trivial MR.  Admitted for scheduled cath due to persistent chest pain and dyspnea. . Cath showed high grade 99% mLAD lesion at bifurcation with large diagonal and underwent  PCI & DES. Plan to continue DAPT & statin. Renal function stable. Referred to cardiac rehab.   Cath procedure list below. Dr Haroldine Laws assess and deemed stable for d/c to home today.    Discharge Vitals: Blood pressure 106/74, pulse 66, temperature 98.2 F (36.8 C), temperature source Oral, resp. rate 18, height 5' 10"  (1.778 m), weight 96.3 kg, SpO2 96 %.  Labs: Lab Results  Component Value Date   WBC 6.8 11/14/2020   HGB 14.0 11/14/2020   HCT 42.9 11/14/2020   MCV 93.9 11/14/2020   PLT 223 11/14/2020    Recent Labs  Lab 11/14/20 0243  NA 136  K 4.1  CL 103  CO2 26  BUN 13  CREATININE 0.98  CALCIUM 9.1  GLUCOSE 99   Lab Results  Component Value Date   CHOL 96 11/14/2020   HDL 30 (L) 11/14/2020   LDLCALC 23 11/14/2020   TRIG 217 (H) 11/14/2020   BNP (last 3 results) No results for input(s): BNP in the last 8760 hours.  ProBNP (last 3 results) No results for input(s): PROBNP in the last 8760 hours.   Diagnostic Studies/Procedures   CARDIAC CATHETERIZATION  Result Date: 11/13/2020  A drug-eluting stent was successfully placed using a SYNERGY XD 2.75X38.  Successful  PTCA and stenting of critical stenosis in the mid LAD/second diagonal bifurcation ([place] 1, 1, 1 lesion).  LAD lesion treated with a 2.75 x 38 mm Synergy DES, postdilated under intravascular ultrasound guidance with a 3.0 mm noncompliant balloon distally and a 3.5 mm noncompliant balloon proximally.  Diagonal lesion treated provisionally through the stent struts with a 2.5 mm balloon with 20% residual stenosis.  TIMI-3 flow is present in both vessels post PCI.  Intravascular ultrasound demonstrates wide stent patency, good stent expansion, and good proximal and distal stent transition zones. Recommend: Overnight observation secondary to lesion complexity, DAPT with aspirin and clopidogrel x 12 months without interruption.   CARDIAC CATHETERIZATION  Result Date: 11/13/2020  Mid LAD lesion is 99% stenosed.  RPDA lesion is 100% stenosed.  Findings: Ao = 141/87 (113) LV = 145/19 1. LM ok 2. LAD 99% mid LAD lesion at bifurcation of large diagonal. 3. LCk ok 4. RCA small PDA occluded at ostium with L to R collaterals Plan PCI of LAD with Dr. Burt Knack. Glori Bickers, MD 9:27 AM    Discharge Medications   Allergies as of 11/14/2020       Reactions   Penicillins Rash        Medication List     STOP taking these medications    ibuprofen 200 MG tablet Commonly known as: ADVIL   naproxen sodium 220  MG tablet Commonly known as: ALEVE       TAKE these medications    aspirin 81 MG tablet Take 81 mg by mouth daily.   clopidogrel 75 MG tablet Commonly known as: PLAVIX Take 1 tablet (75 mg total) by mouth daily. What changed:  how much to take how to take this when to take this additional instructions   ezetimibe 10 MG tablet Commonly known as: ZETIA Take 1 tablet (10 mg total) by mouth daily.   Fish Oil 1200 MG Caps Take 1,200 mg by mouth daily.   lisinopril 20 MG tablet Commonly known as: ZESTRIL TAKE 1 TABLET (20 MG TOTAL) BY MOUTH DAILY. What changed:  how much to  take how to take this when to take this additional instructions   metoprolol succinate 25 MG 24 hr tablet Commonly known as: TOPROL-XL Take 1 tablet (25 mg total) by mouth daily.   Multivital tablet Take 1 tablet by mouth daily.   nitroGLYCERIN 0.4 MG SL tablet Commonly known as: NITROSTAT Place 1 tablet (0.4 mg total) under the tongue every 5 (five) minutes as needed for chest pain.   rosuvastatin 40 MG tablet Commonly known as: CRESTOR Take 1 tablet (40 mg total) by mouth daily.   Vitamin D 50 MCG (2000 UT) tablet Take 4,000-6,000 Units by mouth daily.        Disposition   The patient will be discharged in stable condition to home. Discharge Instructions     Amb Referral to Cardiac Rehabilitation   Complete by: As directed    Diagnosis: Coronary Stents   After initial evaluation and assessments completed: Virtual Based Care may be provided alone or in conjunction with Phase 2 Cardiac Rehab based on patient barriers.: Yes   Diet - low sodium heart healthy   Complete by: As directed    Increase activity slowly   Complete by: As directed        Follow-up Information     Lance Terry, Lance Pascal, MD Follow up on 11/30/2020.   Specialty: Cardiology Why: at 9:20 . Located on the 1st floor of [facility]. Entrance C off [address]. Contact information: [place] [address] [phone]                   Duration of Discharge Encounter: Greater than 35 minutes   Signed, Darrick Grinder NP-C  11/14/2020, 9:18 AM  He is s/p successful PCI of LAD. [place] for D/c. Referred to CR. Continue DAPT and statin.   Glori Bickers, MD  4:05 PM

## 2020-11-14 NOTE — Progress Notes (Signed)
CARDIAC REHAB PHASE I   PRE:  Rate/Rhythm: 71 SR      SaO2: 98 RA  MODE:  Ambulation: 750 ft   POST:  Rate/Rhythm: 82 SR  BP:  Sitting: 142/100    SaO2: 97 RA   Pt ambulated 736f in hallway independently with steady gait. Pt denies CP, SOB, or dizziness. Pt educated on importance of ASA, Plavix, statin, and NTG. Pt with stent card at bedside. Given heart healthy diet. Reviewed site care, restrictions, and exercise guidelines. Will refer to [facility] [place].  [id number] Falco RN BSN 11/14/2020 8:40 AM

## 2020-11-22 ENCOUNTER — Encounter: Payer: BC Managed Care – PPO | Attending: Internal Medicine | Admitting: *Deleted

## 2020-11-22 ENCOUNTER — Other Ambulatory Visit: Payer: Self-pay

## 2020-11-22 DIAGNOSIS — Z955 Presence of coronary angioplasty implant and graft: Secondary | ICD-10-CM | POA: Insufficient documentation

## 2020-11-22 NOTE — Progress Notes (Signed)
Initial telephone orientation completed. Diagnosis can be found in [facility] 4/25. EP orientation scheduled for Thursday 5/5 at 8am.

## 2020-11-23 VITALS — Ht 70.5 in | Wt 212.0 lb

## 2020-11-23 DIAGNOSIS — Z955 Presence of coronary angioplasty implant and graft: Secondary | ICD-10-CM

## 2020-11-23 NOTE — Progress Notes (Signed)
Cardiac Individual Treatment Plan  Patient Details  Name: Lance Terry MRN: [id number] Date of Birth: 21-Jul-1976 Referring Provider:   Flowsheet Row Cardiac Rehab from 11/23/2020 in [facility] Cardiac and Pulmonary Rehab  Referring Provider Glori Bickers MD      Initial Encounter Date:  Flowsheet Row Cardiac Rehab from 11/23/2020 in [facility] Cardiac and Pulmonary Rehab  Date 11/23/20      Visit Diagnosis: Status post coronary artery stent placement  Patient's Home Medications on Admission:  Current Outpatient Medications:  .  aspirin 81 MG tablet, Take 81 mg by mouth daily., Disp: , Rfl:  .  Cholecalciferol (VITAMIN D) 50 MCG (2000 UT) tablet, Take 4,000-6,000 Units by mouth daily., Disp: , Rfl:  .  clopidogrel (PLAVIX) 75 MG tablet, Take 1 tablet (75 mg total) by mouth daily., Disp: 30 tablet, Rfl: 6 .  ezetimibe (ZETIA) 10 MG tablet, Take 1 tablet (10 mg total) by mouth daily., Disp: 90 tablet, Rfl: 3 .  icosapent Ethyl (VASCEPA) 1 g capsule, Take 2 capsules (2 g total) by mouth 2 (two) times daily., Disp: 120 capsule, Rfl: 6 .  lisinopril (ZESTRIL) 20 MG tablet, TAKE 1 TABLET (20 MG TOTAL) BY MOUTH DAILY. (Patient taking differently: Take 20 mg by mouth daily.), Disp: 90 tablet, Rfl: 3 .  metoprolol succinate (TOPROL-XL) 25 MG 24 hr tablet, Take 1 tablet (25 mg total) by mouth daily., Disp: 90 tablet, Rfl: 3 .  Multiple Vitamins-Minerals (MULTIVITAL) tablet, Take 1 tablet by mouth daily., Disp: , Rfl:  .  nitroGLYCERIN (NITROSTAT) 0.4 MG SL tablet, Place 1 tablet (0.4 mg total) under the tongue every 5 (five) minutes as needed for chest pain., Disp: 25 tablet, Rfl: 3 .  rosuvastatin (CRESTOR) 40 MG tablet, Take 1 tablet (40 mg total) by mouth daily., Disp: 90 tablet, Rfl: 3  Past Medical History: Past Medical History:  Diagnosis Date  . Coronary artery disease    -small NSTEMI in setting of heat stroke 7/10 -Cath 50-60% LAD; 7/10 -ET.[name]: EF 71% no ischemia 7/10   . Hyperlipidemia     mixed  . Hypertension     Tobacco Use: Social History   Tobacco Use  Smoking Status Former Smoker  Smokeless Tobacco Never Used    Labs: Recent [employer] for ITP Cardiac and Pulmonary Rehab Latest Ref Rng & Units 02/16/2019 05/02/2020 11/14/2020   Cholestrol 0 - 200 mg/dL 150 143 96   LDLCALC 0 - 99 mg/dL 44 77 23   HDL >40 mg/dL 36(L) 42 30(L)   Trlycerides <150 mg/dL 349(H) 139 217(H)   Hemoglobin A1c 4.8 - 5.6 % - - 5.5       Exercise Target Goals: Exercise Program Goal: Individual exercise prescription set using results from initial 6 min walk test and THRR while considering  patient's activity barriers and safety.   Exercise Prescription Goal: Initial exercise prescription builds to 30-45 minutes a day of aerobic activity, 2-3 days per week.  Home exercise guidelines will be given to patient during program as part of exercise prescription that the participant will acknowledge.   Education: Aerobic Exercise: - Group verbal and visual presentation on the components of exercise prescription. Introduces F.I.T.T principle from ACSM for exercise prescriptions.  Reviews F.I.T.T. principles of aerobic exercise including progression. Written material given at graduation.   Education: Resistance Exercise: - Group verbal and visual presentation on the components of exercise prescription. Introduces F.I.T.T principle from ACSM for exercise prescriptions  Reviews F.I.T.T. principles  of resistance exercise including progression. Written material given at graduation.    Education: Exercise & Equipment Safety: - Individual verbal instruction and demonstration of equipment use and safety with use of the equipment. Flowsheet Row Cardiac Rehab from 11/23/2020 in [facility] Cardiac and Pulmonary Rehab  Education need identified 11/23/20  Date 11/23/20  Educator [place]  Instruction Review Code 1- Verbalizes Understanding      Education: Exercise Physiology & General  Exercise Guidelines: - Group verbal and written instruction with models to review the exercise physiology of the cardiovascular system and associated critical values. Provides general exercise guidelines with specific guidelines to those with heart or lung disease.    Education: Flexibility, Balance, Mind/Body Relaxation: - Group verbal and visual presentation with interactive activity on the components of exercise prescription. Introduces F.I.T.T principle from ACSM for exercise prescriptions. Reviews F.I.T.T. principles of flexibility and balance exercise training including progression. Also discusses the mind body connection.  Reviews various relaxation techniques to help reduce and manage stress (i.e. Deep breathing, progressive muscle relaxation, and visualization). Balance handout provided to take home. Written material given at graduation.   Activity Barriers & Risk Stratification:  Activity Barriers & Cardiac Risk Stratification - 11/23/20 1156      Activity Barriers & Cardiac Risk Stratification   Activity Barriers Other (comment)    Comments left elbow hurts    Cardiac Risk Stratification Moderate           6 Minute Walk:  6 Minute Walk    Row Name 11/23/20 1145         6 Minute Walk   Phase Initial     Distance 1760 feet     Walk Time 6 minutes     # of Rest Breaks 0     MPH 3.33     METS 4.97     RPE 11     Perceived Dyspnea  1     VO2 Peak 17.4     Symptoms Yes (comment)     Comments Slight SOB     Resting HR 71 bpm     Resting BP 114/74     Resting Oxygen Saturation  99 %     Exercise Oxygen Saturation  during 6 min walk 100 %     Max Ex. HR 107 bpm     Max Ex. BP 124/78     2 Minute Post BP 112/78            Oxygen Initial Assessment:   Oxygen Re-Evaluation:   Oxygen Discharge (Final Oxygen Re-Evaluation):   Initial Exercise Prescription:  Initial Exercise Prescription - 11/23/20 1300      Date of Initial Exercise RX and Referring Provider    Date 11/23/20    Referring Provider Glori Bickers MD      Treadmill   MPH 3.1    Grade 2    Minutes 15    METs 4.23      Recumbant Bike   Level 4    RPM 50    Watts 45    Minutes 15    METs 4.9      NuStep   Level 4    SPM 80    Minutes 15    METs 4.9      REL-XR   Level 4    Speed 50    Minutes 15    METs 4.9      Track   Laps 45    Minutes 15  METs 3.4      Prescription Details   Frequency (times per week) 3    Duration Progress to 30 minutes of continuous aerobic without signs/symptoms of physical distress      Intensity   THRR 40-80% of Max Heartrate 112-154    Ratings of Perceived Exertion 11-13    Perceived Dyspnea 0-4      Progression   Progression Continue to progress workloads to maintain intensity without signs/symptoms of physical distress.      Resistance Training   Training Prescription Yes    Weight 6 lb    Reps 10-15           Perform Capillary Blood Glucose checks as needed.  Exercise Prescription Changes:  Exercise Prescription Changes    Row Name 11/23/20 1300             Response to Exercise   Blood Pressure (Admit) 114/74       Blood Pressure (Exercise) 124/78       Blood Pressure (Exit) 112/78       Heart Rate (Admit) 71 bpm       Heart Rate (Exercise) 107 bpm       Heart Rate (Exit) 68 bpm       Oxygen Saturation (Admit) 99 %       Oxygen Saturation (Exercise) 100 %       Oxygen Saturation (Exit) 99 %       Rating of Perceived Exertion (Exercise) 11       Perceived Dyspnea (Exercise) 1       Symptoms Slight SOB       Comments walk test results              Exercise Comments:   Exercise Goals and Review:  Exercise Goals    Row Name 11/23/20 1401             Exercise Goals   Increase Physical Activity Yes       Intervention Provide advice, education, support and counseling about physical activity/exercise needs.;Develop an individualized exercise prescription for aerobic and resistive training  based on initial evaluation findings, risk stratification, comorbidities and participant's personal goals.       Expected Outcomes Short Term: Attend rehab on a regular basis to increase amount of physical activity.;Long Term: Add in home exercise to make exercise part of routine and to increase amount of physical activity.;Long Term: Exercising regularly at least 3-5 days a week.       Increase Strength and Stamina Yes       Intervention Provide advice, education, support and counseling about physical activity/exercise needs.;Develop an individualized exercise prescription for aerobic and resistive training based on initial evaluation findings, risk stratification, comorbidities and participant's personal goals.       Expected Outcomes Short Term: Perform resistance training exercises routinely during rehab and add in resistance training at home;Short Term: Increase workloads from initial exercise prescription for resistance, speed, and METs.;Long Term: Improve cardiorespiratory fitness, muscular endurance and strength as measured by increased METs and functional capacity (6MWT)       Able to understand and use rate of perceived exertion (RPE) scale Yes       Intervention Provide education and explanation on how to use RPE scale       Expected Outcomes Short Term: Able to use RPE daily in rehab to express subjective intensity level;Long Term:  Able to use RPE to guide intensity level when exercising independently  Able to understand and use Dyspnea scale Yes       Intervention Provide education and explanation on how to use Dyspnea scale       Expected Outcomes Long Term: Able to use Dyspnea scale to guide intensity level when exercising independently;Short Term: Able to use Dyspnea scale daily in rehab to express subjective sense of shortness of breath during exertion       Knowledge and understanding of Target Heart Rate Range (THRR) Yes       Intervention Provide education and explanation of  THRR including how the numbers were predicted and where they are located for reference       Expected Outcomes Short Term: Able to state/look up THRR;Short Term: Able to use daily as guideline for intensity in rehab;Long Term: Able to use THRR to govern intensity when exercising independently       Able to check pulse independently Yes       Intervention Provide education and demonstration on how to check pulse in carotid and radial arteries.;Review the importance of being able to check your own pulse for safety during independent exercise       Expected Outcomes Short Term: Able to explain why pulse checking is important during independent exercise;Long Term: Able to check pulse independently and accurately       Understanding of Exercise Prescription Yes       Intervention Provide education, explanation, and written materials on patient's individual exercise prescription       Expected Outcomes Long Term: Able to explain home exercise prescription to exercise independently;Short Term: Able to explain program exercise prescription              Exercise Goals Re-Evaluation :   Discharge Exercise Prescription (Final Exercise Prescription Changes):  Exercise Prescription Changes - 11/23/20 1300      Response to Exercise   Blood Pressure (Admit) 114/74    Blood Pressure (Exercise) 124/78    Blood Pressure (Exit) 112/78    Heart Rate (Admit) 71 bpm    Heart Rate (Exercise) 107 bpm    Heart Rate (Exit) 68 bpm    Oxygen Saturation (Admit) 99 %    Oxygen Saturation (Exercise) 100 %    Oxygen Saturation (Exit) 99 %    Rating of Perceived Exertion (Exercise) 11    Perceived Dyspnea (Exercise) 1    Symptoms Slight SOB    Comments walk test results           Nutrition:  Target Goals: Understanding of nutrition guidelines, daily intake of sodium <1516m, cholesterol <2045m calories 30% from fat and 7% or less from saturated fats, daily to have 5 or more servings of fruits and  vegetables.  Education: All About Nutrition: -Group instruction provided by verbal, written material, interactive activities, discussions, models, and posters to present general guidelines for heart healthy nutrition including fat, fiber, MyPlate, the role of sodium in heart healthy nutrition, utilization of the nutrition label, and utilization of this knowledge for meal planning. Follow up email sent as well. Written material given at graduation.   Biometrics:  Pre Biometrics - 11/23/20 1156      Pre Biometrics   Height 5' 10.5" (1.791 m)    Weight 212 lb (96.2 kg)    BMI (Calculated) 29.98    Single Leg Stand 30 seconds            Nutrition Therapy Plan and Nutrition Goals:   Nutrition Assessments:  MEDIFICTS Score Key:  ?70 Need to  make dietary changes   40-70 Heart Healthy Diet  ? 40 Therapeutic Level Cholesterol Diet  Flowsheet Row Cardiac Rehab from 11/23/2020 in [facility] Cardiac and Pulmonary Rehab  Picture Your Plate Total Score on Admission 63     Picture Your Plate Scores:  <62 Unhealthy dietary pattern with much room for improvement.  41-50 Dietary pattern unlikely to meet recommendations for good health and room for improvement.  51-60 More healthful dietary pattern, with some room for improvement.   >60 Healthy dietary pattern, although there may be some specific behaviors that could be improved.    Nutrition Goals Re-Evaluation:   Nutrition Goals Discharge (Final Nutrition Goals Re-Evaluation):   Psychosocial: Target Goals: Acknowledge presence or absence of significant depression and/or stress, maximize coping skills, provide positive support system. Participant is able to verbalize types and ability to use techniques and skills needed for reducing stress and depression.   Education: Stress, Anxiety, and Depression - Group verbal and visual presentation to define topics covered.  Reviews how body is impacted by stress, anxiety, and depression.  Also  discusses healthy ways to reduce stress and to treat/manage anxiety and depression.  Written material given at graduation.   Education: Sleep Hygiene -Provides group verbal and written instruction about how sleep can affect your health.  Define sleep hygiene, discuss sleep cycles and impact of sleep habits. Review good sleep hygiene tips.    Initial Review & Psychosocial Screening:  Initial Psych Review & Screening - 11/22/20 1536      Initial Review   Current issues with Current Stress Concerns    Source of Stress Concerns Unable to participate in former interests or hobbies      [place]? Yes   wife     Barriers   Psychosocial barriers to participate in program There are no identifiable barriers or psychosocial needs.      Screening Interventions   Expected Outcomes Short Term goal: Utilizing psychosocial counselor, staff and physician to assist with identification of specific Stressors or current issues interfering with healing process. Setting desired goal for each stressor or current issue identified.;Long Term Goal: Stressors or current issues are controlled or eliminated.;Short Term goal: Identification and review with participant of any Quality of Life or Depression concerns found by scoring the questionnaire.;Long Term goal: The participant improves quality of Life and PHQ9 Scores as seen by post scores and/or verbalization of changes           Quality of Life Scores:   Quality of Life - 11/23/20 1204      Quality of Life   Select Quality of Life      Quality of Life Scores   Health/Function Pre 25.6 %    Socioeconomic Pre 21.75 %    Psych/Spiritual Pre 24 %    Family Pre 30 %    GLOBAL Pre 25.03 %          Scores of 19 and below usually indicate a poorer quality of life in these areas.  A difference of  2-3 points is a clinically meaningful difference.  A difference of 2-3 points in the total score of the Quality of Life Index has been  associated with significant improvement in overall quality of life, self-image, physical symptoms, and general health in studies assessing change in quality of life.  PHQ-9: Recent Review Flowsheet Data    Depression screen [facility] 2/9 11/23/2020   Decreased Interest 1   Down, Depressed, Hopeless 1  PHQ - 2 Score 2   Altered sleeping 0   Tired, decreased energy 1   Change in appetite 1   Feeling bad or failure about yourself  1   Trouble concentrating 1   Moving slowly or fidgety/restless 0   Suicidal thoughts 0   PHQ-9 Score 6   Difficult doing work/chores Not difficult at all     Interpretation of Total Score  Total Score Depression Severity:  1-4 = Minimal depression, 5-9 = Mild depression, 10-14 = Moderate depression, 15-19 = Moderately severe depression, 20-27 = Severe depression   Psychosocial Evaluation and Intervention:  Psychosocial Evaluation - 11/22/20 1543      Psychosocial Evaluation & Interventions   Interventions Encouraged to exercise with the program and follow exercise prescription    Comments Keven reports feeling better after his stent. His symptoms started around Thanksgiving so he is thankful to be feeling better. Prior to his initial symptoms, he was preparing to run a half marathon and since his symptoms started he has become more sedentary. He is looking forward to getting back into an exercise routine with the help and supervision of the cardiac rehab staff. 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 His wife and kids are his main support system. He is excited to start the program as he is ready to get back to his hobbies without having his anginal and shortness of breath symptoms.    Expected Outcomes Short: attend cardiac rehab for education and exercise. Long: develop and maintain positive self care habits.    Continue Psychosocial Services  Follow up required by staff           Psychosocial Re-Evaluation:   Psychosocial  Discharge (Final Psychosocial Re-Evaluation):   Vocational Rehabilitation: Provide vocational rehab assistance to qualifying candidates.   Vocational Rehab Evaluation & Intervention:  Vocational Rehab - 11/22/20 1536      Initial Vocational Rehab Evaluation & Intervention   Assessment shows need for Vocational Rehabilitation No           Education: Education Goals: Education classes will be provided on a variety of topics geared toward better understanding of heart health and risk factor modification. Participant will state understanding/return demonstration of topics presented as noted by education test scores.  Learning Barriers/Preferences:  Learning Barriers/Preferences - 11/22/20 1536      Learning Barriers/Preferences   Learning Barriers None    Learning Preferences None           General Cardiac Education Topics:  AED/CPR: - Group verbal and written instruction with the use of models to demonstrate the basic use of the AED with the basic ABC's of resuscitation.   Anatomy and Cardiac Procedures: - Group verbal and visual presentation and models provide information about basic cardiac anatomy and function. Reviews the testing methods done to diagnose heart disease and the outcomes of the test results. Describes the treatment choices: Medical Management, Angioplasty, or Coronary Bypass Surgery for treating various heart conditions including Myocardial Infarction, Angina, Valve Disease, and Cardiac Arrhythmias.  Written material given at graduation.   Medication Safety: - Group verbal and visual instruction to review commonly prescribed medications for heart and lung disease. Reviews the medication, class of the drug, and side effects. Includes the steps to properly store meds and maintain the prescription regimen.  Written material given at graduation.   Intimacy: - Group verbal instruction through game format to discuss how heart and lung  disease can affect sexual  intimacy. Written material given at graduation..   Know Your Numbers and Heart Failure: - Group verbal and visual instruction to discuss disease risk factors for cardiac and pulmonary disease and treatment options.  Reviews associated critical values for Overweight/Obesity, Hypertension, Cholesterol, and Diabetes.  Discusses basics of heart failure: signs/symptoms and treatments.  Introduces Heart Failure Zone chart for action plan for heart failure.  Written material given at graduation.   Infection Prevention: - Provides verbal and written material to individual with discussion of infection control including proper hand washing and proper equipment cleaning during exercise session. Flowsheet Row Cardiac Rehab from 11/23/2020 in [facility] Cardiac and Pulmonary Rehab  Education need identified 11/23/20  Date 11/23/20  Educator [place]  Instruction Review Code 1- Verbalizes Understanding      Falls Prevention: - Provides verbal and written material to individual with discussion of falls prevention and safety. Flowsheet Row Cardiac Rehab from 11/23/2020 in [facility] Cardiac and Pulmonary Rehab  Education need identified 11/23/20  Date 11/23/20  Educator [place]  Instruction Review Code 1- Verbalizes Understanding      Other: -Provides group and verbal instruction on various topics (see comments)   Knowledge Questionnaire Score:  Knowledge Questionnaire Score - 11/23/20 1203      Knowledge Questionnaire Score   Pre Score 23/26: Nitro, nutrition           Core Components/Risk Factors/Patient Goals at Admission:  Personal Goals and Risk Factors at Admission - 11/23/20 1401      Core Components/Risk Factors/Patient Goals on Admission    Weight Management Yes;Weight Loss    Intervention Weight Management: Develop a combined nutrition and exercise program designed to reach desired caloric intake, while maintaining appropriate intake of nutrient and fiber, sodium and fats, and appropriate energy  expenditure required for the weight goal.;Weight Management: Provide education and appropriate resources to help participant work on and attain dietary goals.;Weight Management/Obesity: Establish reasonable short term and long term weight goals.    Admit Weight 212 lb (96.2 kg)    Goal Weight: Short Term 207 lb (93.9 kg)    Goal Weight: Long Term 190 lb (86.2 kg)    Expected Outcomes Short Term: Continue to assess and modify interventions until short term weight is achieved;Long Term: Adherence to nutrition and physical activity/exercise program aimed toward attainment of established weight goal;Weight Loss: Understanding of general recommendations for a balanced deficit meal plan, which promotes 1-2 lb weight loss per week and includes a negative energy balance of [phone] kcal/d;Understanding recommendations for meals to include 15-35% energy as protein, 25-35% energy from fat, 35-60% energy from carbohydrates, less than 268m of dietary cholesterol, 20-35 gm of total fiber daily;Understanding of distribution of calorie intake throughout the day with the consumption of 4-5 meals/snacks    Hypertension Yes    Intervention Provide education on lifestyle modifcations including regular physical activity/exercise, weight management, moderate sodium restriction and increased consumption of fresh fruit, vegetables, and low fat dairy, alcohol moderation, and smoking cessation.;Monitor prescription use compliance.    Expected Outcomes Short Term: Continued assessment and intervention until BP is < 140/934mHG in hypertensive participants. < 130/8037mG in hypertensive participants with diabetes, heart failure or chronic kidney disease.;Long Term: Maintenance of blood pressure at goal levels.    Lipids Yes    Intervention Provide education and support for participant on nutrition & aerobic/resistive exercise along with prescribed medications to achieve LDL <49m79mDL >40mg75m Expected Outcomes Long Term:  Cholesterol controlled with  medications as prescribed, with individualized exercise RX and with personalized nutrition plan. Value goals: LDL < 33m, HDL > 40 mg.;Short Term: Participant states understanding of desired cholesterol values and is compliant with medications prescribed. Participant is following exercise prescription and nutrition guidelines.           Education:Diabetes - Individual verbal and written instruction to review signs/symptoms of diabetes, desired ranges of glucose level fasting, after meals and with exercise. Acknowledge that pre and post exercise glucose checks will be done for 3 sessions at entry of program.   Core Components/Risk Factors/Patient Goals Review:    Core Components/Risk Factors/Patient Goals at Discharge (Final Review):    ITP Comments:  ITP Comments    Row Name 11/22/20 1540 11/23/20 0948         ITP Comments Initial telephone orientation completed. Diagnosis can be found in [facility] [facility] EP orientation scheduled for Thursday 5/5 at 8am. Completed 6MWT and gym orientation. Initial ITP created and sent for review to Dr. MEmily Filbert Medical Director.             Comments: Initial ITP

## 2020-11-23 NOTE — Patient Instructions (Signed)
Patient Instructions  Patient Details  Name: Lance Terry MRN: [id number] Date of Birth: 1976-01-18 Referring Provider:  Jolaine Artist, MD  Below are your personal goals for exercise, nutrition, and risk factors. Our goal is to help you stay on track towards obtaining and maintaining these goals. We will be discussing your progress on these goals with you throughout the program.  Initial Exercise Prescription:  Initial Exercise Prescription - 11/23/20 1300      Date of Initial Exercise RX and Referring Provider   Date 11/23/20    Referring Provider Glori Bickers MD      Treadmill   MPH 3.1    Grade 2    Minutes 15    METs 4.23      Recumbant Bike   Level 4    RPM 50    Watts 45    Minutes 15    METs 4.9      NuStep   Level 4    SPM 80    Minutes 15    METs 4.9      REL-XR   Level 4    Speed 50    Minutes 15    METs 4.9      Track   Laps 45    Minutes 15    METs 3.4      Prescription Details   Frequency (times per week) 3    Duration Progress to 30 minutes of continuous aerobic without signs/symptoms of physical distress      Intensity   THRR 40-80% of Max Heartrate 112-154    Ratings of Perceived Exertion 11-13    Perceived Dyspnea 0-4      Progression   Progression Continue to progress workloads to maintain intensity without signs/symptoms of physical distress.      Resistance Training   Training Prescription Yes    Weight 6 lb    Reps 10-15           Exercise Goals: Frequency: Be able to perform aerobic exercise two to three times per week in program working toward 2-5 days per week of home exercise.  Intensity: Work with a perceived exertion of 11 (fairly light) - 15 (hard) while following your exercise prescription.  We will make changes to your prescription with you as you progress through the program.   Duration: Be able to do 30 to 45 minutes of continuous aerobic exercise in addition to a 5 minute warm-up and a 5 minute  cool-down routine.   Nutrition Goals: Your personal nutrition goals will be established when you do your nutrition analysis with the dietician.  The following are general nutrition guidelines to follow: Cholesterol < 281m/day Sodium < 15027mday Fiber: Men under 50 yrs - 38 grams per day  Personal Goals:  Personal Goals and Risk Factors at Admission - 11/23/20 1401      Core Components/Risk Factors/Patient Goals on Admission    Weight Management Yes;Weight Loss    Intervention Weight Management: Develop a combined nutrition and exercise program designed to reach desired caloric intake, while maintaining appropriate intake of nutrient and fiber, sodium and fats, and appropriate energy expenditure required for the weight goal.;Weight Management: Provide education and appropriate resources to help participant work on and attain dietary goals.;Weight Management/Obesity: Establish reasonable short term and long term weight goals.    Admit Weight 212 lb (96.2 kg)    Goal Weight: Short Term 207 lb (93.9 kg)    Goal Weight: Long Term 190 lb (86.2 kg)  Expected Outcomes Short Term: Continue to assess and modify interventions until short term weight is achieved;Long Term: Adherence to nutrition and physical activity/exercise program aimed toward attainment of established weight goal;Weight Loss: Understanding of general recommendations for a balanced deficit meal plan, which promotes 1-2 lb weight loss per week and includes a negative energy balance of [id number] kcal/d;Understanding recommendations for meals to include 15-35% energy as protein, 25-35% energy from fat, 35-60% energy from carbohydrates, less than 279m of dietary cholesterol, 20-35 gm of total fiber daily;Understanding of distribution of calorie intake throughout the day with the consumption of 4-5 meals/snacks    Hypertension Yes    Intervention Provide education on lifestyle modifcations including regular physical activity/exercise,  weight management, moderate sodium restriction and increased consumption of fresh fruit, vegetables, and low fat dairy, alcohol moderation, and smoking cessation.;Monitor prescription use compliance.    Expected Outcomes Short Term: Continued assessment and intervention until BP is < 140/935mHG in hypertensive participants. < 130/8071mG in hypertensive participants with diabetes, heart failure or chronic kidney disease.;Long Term: Maintenance of blood pressure at goal levels.    Lipids Yes    Intervention Provide education and support for participant on nutrition & aerobic/resistive exercise along with prescribed medications to achieve LDL <66m60mDL >40mg67m Expected Outcomes Long Term: Cholesterol controlled with medications as prescribed, with individualized exercise RX and with personalized nutrition plan. Value goals: LDL < 66mg,9m > 40 mg.;Short Term: Participant states understanding of desired cholesterol values and is compliant with medications prescribed. Participant is following exercise prescription and nutrition guidelines.           Tobacco Use Initial Evaluation: Social History   Tobacco Use  Smoking Status Former Smoker  Smokeless Tobacco Never Used    Exercise Goals and Review:  Exercise Goals    Row Name 11/23/20 1401             Exercise Goals   Increase Physical Activity Yes       Intervention Provide advice, education, support and counseling about physical activity/exercise needs.;Develop an individualized exercise prescription for aerobic and resistive training based on initial evaluation findings, risk stratification, comorbidities and participant's personal goals.       Expected Outcomes Short Term: Attend rehab on a regular basis to increase amount of physical activity.;Long Term: Add in home exercise to make exercise part of routine and to increase amount of physical activity.;Long Term: Exercising regularly at least 3-5 days a week.       Increase Strength  and Stamina Yes       Intervention Provide advice, education, support and counseling about physical activity/exercise needs.;Develop an individualized exercise prescription for aerobic and resistive training based on initial evaluation findings, risk stratification, comorbidities and participant's personal goals.       Expected Outcomes Short Term: Perform resistance training exercises routinely during rehab and add in resistance training at home;Short Term: Increase workloads from initial exercise prescription for resistance, speed, and METs.;Long Term: Improve cardiorespiratory fitness, muscular endurance and strength as measured by increased METs and functional capacity (6MWT)       Able to understand and use rate of perceived exertion (RPE) scale Yes       Intervention Provide education and explanation on how to use RPE scale       Expected Outcomes Short Term: Able to use RPE daily in rehab to express subjective intensity level;Long Term:  Able to use RPE to guide intensity level when exercising independently  Able to understand and use Dyspnea scale Yes       Intervention Provide education and explanation on how to use Dyspnea scale       Expected Outcomes Long Term: Able to use Dyspnea scale to guide intensity level when exercising independently;Short Term: Able to use Dyspnea scale daily in rehab to express subjective sense of shortness of breath during exertion       Knowledge and understanding of Target Heart Rate Range (THRR) Yes       Intervention Provide education and explanation of THRR including how the numbers were predicted and where they are located for reference       Expected Outcomes Short Term: Able to state/look up THRR;Short Term: Able to use daily as guideline for intensity in rehab;Long Term: Able to use THRR to govern intensity when exercising independently       Able to check pulse independently Yes       Intervention Provide education and demonstration on how to check  pulse in carotid and radial arteries.;Review the importance of being able to check your own pulse for safety during independent exercise       Expected Outcomes Short Term: Able to explain why pulse checking is important during independent exercise;Long Term: Able to check pulse independently and accurately       Understanding of Exercise Prescription Yes       Intervention Provide education, explanation, and written materials on patient's individual exercise prescription       Expected Outcomes Long Term: Able to explain home exercise prescription to exercise independently;Short Term: Able to explain program exercise prescription              Copy of goals given to participant.

## 2020-11-24 ENCOUNTER — Other Ambulatory Visit: Payer: Self-pay

## 2020-11-24 ENCOUNTER — Encounter: Payer: BC Managed Care – PPO | Admitting: *Deleted

## 2020-11-24 ENCOUNTER — Ambulatory Visit: Payer: BC Managed Care – PPO | Admitting: Cardiovascular Disease

## 2020-11-24 DIAGNOSIS — Z955 Presence of coronary angioplasty implant and graft: Secondary | ICD-10-CM

## 2020-11-24 NOTE — Progress Notes (Signed)
Daily Session Note  Patient Details  Name: Lance Terry MRN: [id number] Date of Birth: 07-03-1976 Referring Provider:   Flowsheet Row Cardiac Rehab from 11/23/2020 in [facility] Cardiac and Pulmonary Rehab  Referring Provider Glori Bickers MD      Encounter Date: 11/24/2020  Check In:  Session Check In - 11/24/20 8413      Check-In   Supervising physician immediately available to respond to emergencies See telemetry face sheet for immediately available ER MD    Location [facility]-Cardiac & Pulmonary Rehab    Staff Present Heath Lark, RN, BSN, CCRP;Jessica Fairlawn, MA, RCEP, CCRP, CCET;Joseph Seaview RCP,RRT,BSRT    Virtual Visit No    Medication changes reported     No    Fall or balance concerns reported    No    Warm-up and Cool-down Performed on first and last piece of equipment    Resistance Training Performed Yes    VAD Patient? No    PAD/SET Patient? No      Pain Assessment   Currently in Pain? No/denies              Social History   Tobacco Use  Smoking Status Former Smoker  Smokeless Tobacco Never Used    Goals Met:  Exercise tolerated well Personal goals reviewed No report of cardiac concerns or symptoms  Goals Unmet:  Not Applicable  Comments: First full day of exercise!  Patient was oriented to gym and equipment including functions, settings, policies, and procedures.  Patient's individual exercise prescription and treatment plan were reviewed.  All starting workloads were established based on the results of the 6 minute walk test done at initial orientation visit.  The plan for exercise progression was also introduced and progression will be customized based on patient's performance and goals.    Dr. Emily Filbert is Medical Director for [place] and [facility].

## 2020-11-27 ENCOUNTER — Other Ambulatory Visit: Payer: Self-pay

## 2020-11-27 ENCOUNTER — Encounter: Payer: BC Managed Care – PPO | Admitting: *Deleted

## 2020-11-27 DIAGNOSIS — Z955 Presence of coronary angioplasty implant and graft: Secondary | ICD-10-CM

## 2020-11-27 NOTE — Progress Notes (Signed)
Daily Session Note  Patient Details  Name: Lance Terry MRN: [id number] Date of Birth: 04/17/1976 Referring Provider:   Flowsheet Row Cardiac Rehab from 11/23/2020 in [facility] Cardiac and Pulmonary Rehab  Referring Provider Bensimhon, Daniel MD      Encounter Date: 11/27/2020  Check In:  Session Check In - 11/27/20 0846      Check-In   Supervising physician immediately available to respond to emergencies See telemetry face sheet for immediately available ER MD    Location [facility]-Cardiac & Pulmonary Rehab    Staff Present Susanne Bice, RN, BSN, CCRP;Joseph Hood RCP,RRT,BSRT;Kelly Hayes, BS, ACSM CEP, Exercise Physiologist    Virtual Visit No    Medication changes reported     No    Fall or balance concerns reported    No    Warm-up and Cool-down Performed on first and last piece of equipment    Resistance Training Performed Yes    VAD Patient? No    PAD/SET Patient? No      Pain Assessment   Currently in Pain? No/denies              Social History   Tobacco Use  Smoking Status Former Smoker  Smokeless Tobacco Never Used    Goals Met:  Exercise tolerated well No report of cardiac concerns or symptoms  Goals Unmet:  Not Applicable  Comments: Pt able to follow exercise prescription today without complaint.  Will continue to monitor for progression.    Dr. Mark Miller is Medical Director for [facility] and [facility]. 

## 2020-11-29 ENCOUNTER — Other Ambulatory Visit: Payer: Self-pay

## 2020-11-29 DIAGNOSIS — Z955 Presence of coronary angioplasty implant and graft: Secondary | ICD-10-CM

## 2020-11-29 NOTE — Progress Notes (Signed)
Daily Session Note  Patient Details  Name: Lance Terry MRN: [id number] Date of Birth: Jan 09, 1976 Referring Provider:   Flowsheet Row Cardiac Rehab from 11/23/2020 in [facility] Cardiac and Pulmonary Rehab  Referring Provider Glori Bickers MD      Encounter Date: 11/29/2020  Check In:  Session Check In - 11/29/20 0808      Check-In   Supervising physician immediately available to respond to emergencies See telemetry face sheet for immediately available ER MD    Location [facility]-Cardiac & Pulmonary Rehab    Staff Present Birdie Sons, MPA, Elveria Rising, BA, ACSM CEP, Exercise Physiologist;Joseph Tessie Fass RCP,RRT,BSRT    Virtual Visit No    Medication changes reported     No    Fall or balance concerns reported    No    Warm-up and Cool-down Performed on first and last piece of equipment    Resistance Training Performed Yes    VAD Patient? No    PAD/SET Patient? No      Pain Assessment   Currently in Pain? No/denies              Social History   Tobacco Use  Smoking Status Former Smoker  Smokeless Tobacco Never Used    Goals Met:  Independence with exercise equipment Exercise tolerated well No report of cardiac concerns or symptoms Strength training completed today  Goals Unmet:  Not Applicable  Comments: Pt able to follow exercise prescription today without complaint.  Will continue to monitor for progression.    Dr. Emily Filbert is Medical Director for [place] and [facility].

## 2020-11-30 ENCOUNTER — Encounter (HOSPITAL_COMMUNITY): Payer: Self-pay | Admitting: Internal Medicine

## 2020-11-30 ENCOUNTER — Ambulatory Visit (HOSPITAL_COMMUNITY)
Admit: 2020-11-30 | Discharge: 2020-11-30 | Disposition: A | Discharge status: Home / Self Care | Payer: BC Managed Care – PPO | Attending: Internal Medicine | Admitting: Internal Medicine

## 2020-11-30 VITALS — BP 122/80 | HR 70 | Wt 213.8 lb

## 2020-11-30 DIAGNOSIS — E782 Mixed hyperlipidemia: Secondary | ICD-10-CM

## 2020-11-30 DIAGNOSIS — I2510 Atherosclerotic heart disease of native coronary artery without angina pectoris: Secondary | ICD-10-CM | POA: Insufficient documentation

## 2020-11-30 DIAGNOSIS — I10 Essential (primary) hypertension: Secondary | ICD-10-CM | POA: Insufficient documentation

## 2020-11-30 DIAGNOSIS — R0789 Other chest pain: Secondary | ICD-10-CM | POA: Insufficient documentation

## 2020-11-30 DIAGNOSIS — Z79899 Other long term (current) drug therapy: Secondary | ICD-10-CM | POA: Insufficient documentation

## 2020-11-30 DIAGNOSIS — E785 Hyperlipidemia, unspecified: Secondary | ICD-10-CM | POA: Diagnosis not present

## 2020-11-30 DIAGNOSIS — Z7982 Long term (current) use of aspirin: Secondary | ICD-10-CM | POA: Diagnosis not present

## 2020-11-30 DIAGNOSIS — Z7902 Long term (current) use of antithrombotics/antiplatelets: Secondary | ICD-10-CM | POA: Insufficient documentation

## 2020-11-30 DIAGNOSIS — Z955 Presence of coronary angioplasty implant and graft: Secondary | ICD-10-CM | POA: Insufficient documentation

## 2020-11-30 DIAGNOSIS — I252 Old myocardial infarction: Secondary | ICD-10-CM | POA: Insufficient documentation

## 2020-11-30 NOTE — Progress Notes (Signed)
CARDIOLOGY CLINIC VISIT  PCP: Dr. 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 Cath from 7/10 mid LAD lesion  50-60%.   Echo 1/22 EF 55-60%   Underwent cath 11/13/20 for + CT scan  1. LM ok 2. LAD 99% mid LAD lesion at bifurcation of large diagonal. -> PCI with DES by Dr. Burt Terry 3. LCk ok 4. RCA small PDA occluded at ostium with L to R collaterals  Here for post-cath f/u. Says he is feeling about 85% back. Going to CR. Was able to run a mile recently. Occasional chest tightness but can happen at any time. Feels like heartburn or warm feeling.   Lab Results  Component Value Date   CHOL 96 11/14/2020   HDL 30 (L) 11/14/2020   LDLCALC 23 11/14/2020   TRIG 217 (H) 11/14/2020   CHOLHDL 3.2 11/14/2020    ROS: All systems negative except as listed in HPI, PMH and Problem List.  Past Medical History:  Diagnosis Date  . Coronary artery disease    -small NSTEMI in setting of heat stroke 7/10 -Cath 50-60% LAD; 7/10 -ET.Lance Terry: EF 71% no ischemia 7/10   . Hyperlipidemia    mixed  . Hypertension     Current Outpatient Medications  Medication Sig Dispense Refill  . aspirin 81 MG tablet Take 81 mg by mouth daily.    . Cholecalciferol (VITAMIN D) 50 MCG (2000 UT) tablet Take 4,000-6,000 Units by mouth daily.    . clopidogrel (PLAVIX) 75 MG tablet Take 1 tablet (75 mg total) by mouth daily. 30 tablet 6  . ezetimibe (ZETIA) 10 MG tablet Take 1 tablet (10 mg total) by mouth daily. 90 tablet 3  . icosapent Ethyl (VASCEPA) 1 g capsule Take 2 capsules (2 g total) by mouth 2 (two) times daily. 120 capsule 6  . lisinopril (ZESTRIL) 20 MG tablet TAKE 1 TABLET (20 MG TOTAL) BY MOUTH DAILY. 90 tablet 3  . metoprolol succinate (TOPROL-XL) 25 MG 24 hr tablet Take 1 tablet (25 mg total) by mouth daily. 90 tablet 3  . Multiple Vitamins-Minerals (MULTIVITAL) tablet Take 1 tablet by mouth daily.     . nitroGLYCERIN (NITROSTAT) 0.4 MG SL tablet Place 1 tablet (0.4 mg total) under the tongue every 5 (five) minutes as needed for chest pain. 25 tablet 3  . rosuvastatin (CRESTOR) 40 MG tablet Take 1 tablet (40 mg total) by mouth daily. 90 tablet 3   No current facility-administered medications for this encounter.     PHYSICAL EXAM: Vitals:   11/30/20 0925  BP: 122/80  Pulse: 70  SpO2: 100%  Weight: 97 kg (213 lb 12.8 oz)   General:  Well appearing. No resp difficulty HEENT: normal Neck: supple. JVP flat. Carotids 2+ bilaterally; no bruits. No lymphadenopathy or thryomegaly appreciated. Cor: PMI normal. Regular rate & rhythm. No rubs, gallops or murmurs. Lungs: clear Abdomen: soft, nontender, nondistended. No hepatosplenomegaly. No bruits or masses. Good bowel sounds. Extremities: no cyanosis, clubbing, rash, edema Neuro: alert & orientedx3, cranial nerves grossly intact. Moves all 4 extremities w/o difficulty. Affect pleasant.  ECG: NSR 70  No ST-T wave abnormalities.    ASSESSMENT & PLAN:  1. CAD  - Cath 4/22 with 99% mLAD -> s/p PCI DES - continue DAPT, statin and zetia. Will need Plavix at least 1 year  - Keep LDL < 70 - Continue cardiac rehab -  Discussed Lance Terry porgram  2. HTN - Blood pressure well controlled. Continue current regimen.  3. HL - Followed by Dr. Inda Terry. Continue Crestor, zetia and Vascepa. Goal LDL < 70. - last LDL 23  Lance Venable,MD 9:27 AM

## 2020-11-30 NOTE — Patient Instructions (Signed)
Your physician recommends that you schedule a follow-up appointment in: 4 months with echocardiogram  If you have any questions or concerns before your next appointment please send [place] a message through [place] or call our office at [phone].    TO LEAVE A MESSAGE FOR THE NURSE SELECT OPTION 2, PLEASE LEAVE A MESSAGE INCLUDING: . YOUR NAME . DATE OF BIRTH . CALL BACK NUMBER . REASON FOR CALL**this is important as we prioritize the call backs  [place] AS LONG AS YOU CALL BEFORE 4:00 PM  At the [facility], you and your health needs are our priority. As part of our continuing mission to provide you with exceptional heart care, we have created designated Provider Care Teams. These Care Teams include your primary Cardiologist (physician) and Advanced Practice Providers (APPs- Physician Assistants and Nurse Practitioners) who all work together to provide you with the care you need, when you need it.   You may see any of the following providers on your designated Care Team at your next follow up: Marland Kitchen Dr Glori Bickers . Dr Loralie Champagne . Dr Vickki Muff . Darrick Grinder, NP . Lyda Jester, [place] . Audry Riles, PharmD   Please be sure to bring in all your medications bottles to every appointment.

## 2020-11-30 NOTE — Addendum Note (Signed)
Encounter addended by: Scarlette Calico, RN on: 11/30/2020 10:48 AM  Actions taken: Clinical Note Signed

## 2020-11-30 NOTE — Addendum Note (Signed)
Encounter addended by: Scarlette Calico, RN on: 11/30/2020 10:56 AM  Actions taken: Order list changed, Diagnosis association updated

## 2020-12-01 ENCOUNTER — Other Ambulatory Visit: Payer: Self-pay

## 2020-12-01 ENCOUNTER — Encounter: Payer: BC Managed Care – PPO | Admitting: *Deleted

## 2020-12-01 DIAGNOSIS — Z955 Presence of coronary angioplasty implant and graft: Secondary | ICD-10-CM

## 2020-12-01 NOTE — Progress Notes (Signed)
Daily Session Note  Patient Details  Name: Lance Terry MRN: [id number] Date of Birth: 03-25-1976 Referring Provider:   Flowsheet Row Cardiac Rehab from 11/23/2020 in [facility] Cardiac and Pulmonary Rehab  Referring Provider Glori Bickers MD      Encounter Date: 12/01/2020  Check In:  Session Check In - 12/01/20 0833      Check-In   Supervising physician immediately available to respond to emergencies See telemetry face sheet for immediately available ER MD    Location [facility]-Cardiac & Pulmonary Rehab    Staff Present Hope Budds RDN, LDN;Jessica Luan Pulling, MA, RCEP, CCRP, CCET;Ivo Moga, RN, BSN, CCRP    Virtual Visit No    Medication changes reported     No    Fall or balance concerns reported    No    Warm-up and Cool-down Performed on first and last piece of equipment    Resistance Training Performed Yes    VAD Patient? No    PAD/SET Patient? No      Pain Assessment   Currently in Pain? No/denies              Social History   Tobacco Use  Smoking Status Former Smoker  Smokeless Tobacco Never Used    Goals Met:  Independence with exercise equipment Exercise tolerated well No report of cardiac concerns or symptoms  Goals Unmet:  Not Applicable  Comments: Pt able to follow exercise prescription today without complaint.  Will continue to monitor for progression.    Dr. Emily Filbert is Medical Director for [place] and [facility].

## 2020-12-04 ENCOUNTER — Encounter: Payer: BC Managed Care – PPO | Admitting: *Deleted

## 2020-12-04 ENCOUNTER — Other Ambulatory Visit: Payer: Self-pay

## 2020-12-04 DIAGNOSIS — Z955 Presence of coronary angioplasty implant and graft: Secondary | ICD-10-CM | POA: Diagnosis not present

## 2020-12-04 NOTE — Progress Notes (Signed)
Daily Session Note  Patient Details  Name: NAHEEM MOSCO MRN: [id number] Date of Birth: 1975-11-11 Referring Provider:   Flowsheet Row Cardiac Rehab from 11/23/2020 in [facility] Cardiac and Pulmonary Rehab  Referring Provider Glori Bickers MD      Encounter Date: 12/04/2020  Check In:  Session Check In - 12/04/20 0838      Check-In   Supervising physician immediately available to respond to emergencies See telemetry face sheet for immediately available ER MD    Staff Present Heath Lark, RN, BSN, CCRP;Joseph Tedd Sias, [place], ACSM CEP, Exercise Physiologist    Virtual Visit No    Medication changes reported     No    Fall or balance concerns reported    No    Warm-up and Cool-down Performed on first and last piece of equipment    Resistance Training Performed Yes    VAD Patient? No      Pain Assessment   Currently in Pain? No/denies              Social History   Tobacco Use  Smoking Status Former Smoker  Smokeless Tobacco Never Used    Goals Met:  Independence with exercise equipment Exercise tolerated well No report of cardiac concerns or symptoms  Goals Unmet:  Not Applicable  Comments: Pt able to follow exercise prescription today without complaint.  Will continue to monitor for progression.    Dr. Emily Filbert is Medical Director for [place] and [facility].

## 2020-12-05 ENCOUNTER — Other Ambulatory Visit: Payer: Self-pay

## 2020-12-05 DIAGNOSIS — Z955 Presence of coronary angioplasty implant and graft: Secondary | ICD-10-CM | POA: Diagnosis not present

## 2020-12-05 NOTE — Progress Notes (Signed)
Daily Session Note  Patient Details  Name: Lance Terry MRN: [id number] Date of Birth: 10-08-1975 Referring Provider:   Flowsheet Row Cardiac Rehab from 11/23/2020 in [facility] Cardiac and Pulmonary Rehab  Referring Provider Glori Bickers MD      Encounter Date: 12/05/2020  Check In:  Session Check In - 12/05/20 0726      Check-In   Supervising physician immediately available to respond to emergencies See telemetry face sheet for immediately available ER MD    Location [facility]-Cardiac & Pulmonary Rehab    Staff Present Birdie Sons, MPA, Elveria Rising, BA, ACSM CEP, Exercise Physiologist;Joseph Tessie Fass RCP,RRT,BSRT    Virtual Visit No    Medication changes reported     No    Fall or balance concerns reported    No    Warm-up and Cool-down Performed on first and last piece of equipment    Resistance Training Performed Yes    VAD Patient? No    PAD/SET Patient? No      Pain Assessment   Currently in Pain? No/denies              Social History   Tobacco Use  Smoking Status Former Smoker  Smokeless Tobacco Never Used    Goals Met:  Independence with exercise equipment Exercise tolerated well No report of cardiac concerns or symptoms Strength training completed today  Goals Unmet:  Not Applicable  Comments: Pt able to follow exercise prescription today without complaint.  Will continue to monitor for progression.    Dr. Emily Filbert is Medical Director for [place] and [facility].

## 2020-12-06 ENCOUNTER — Encounter: Payer: Self-pay | Admitting: *Deleted

## 2020-12-06 DIAGNOSIS — Z955 Presence of coronary angioplasty implant and graft: Secondary | ICD-10-CM

## 2020-12-06 NOTE — Progress Notes (Signed)
Cardiac Individual Treatment Plan  Patient Details  Name: Lance Terry MRN: [id number] Date of Birth: August 10, 1975 Referring Provider:   Flowsheet Row Cardiac Rehab from 11/23/2020 in [facility] Cardiac and Pulmonary Rehab  Referring Provider Glori Bickers MD      Initial Encounter Date:  Flowsheet Row Cardiac Rehab from 11/23/2020 in [facility] Cardiac and Pulmonary Rehab  Date 11/23/20      Visit Diagnosis: Status post coronary artery stent placement  Patient's Home Medications on Admission:  Current Outpatient Medications:  .  aspirin 81 MG tablet, Take 81 mg by mouth daily., Disp: , Rfl:  .  Cholecalciferol (VITAMIN D) 50 MCG (2000 UT) tablet, Take 4,000-6,000 Units by mouth daily., Disp: , Rfl:  .  clopidogrel (PLAVIX) 75 MG tablet, Take 1 tablet (75 mg total) by mouth daily., Disp: 30 tablet, Rfl: 6 .  ezetimibe (ZETIA) 10 MG tablet, Take 1 tablet (10 mg total) by mouth daily., Disp: 90 tablet, Rfl: 3 .  icosapent Ethyl (VASCEPA) 1 g capsule, Take 2 capsules (2 g total) by mouth 2 (two) times daily., Disp: 120 capsule, Rfl: 6 .  lisinopril (ZESTRIL) 20 MG tablet, TAKE 1 TABLET (20 MG TOTAL) BY MOUTH DAILY., Disp: 90 tablet, Rfl: 3 .  metoprolol succinate (TOPROL-XL) 25 MG 24 hr tablet, Take 1 tablet (25 mg total) by mouth daily., Disp: 90 tablet, Rfl: 3 .  Multiple Vitamins-Minerals (MULTIVITAL) tablet, Take 1 tablet by mouth daily., Disp: , Rfl:  .  nitroGLYCERIN (NITROSTAT) 0.4 MG SL tablet, Place 1 tablet (0.4 mg total) under the tongue every 5 (five) minutes as needed for chest pain., Disp: 25 tablet, Rfl: 3 .  rosuvastatin (CRESTOR) 40 MG tablet, Take 1 tablet (40 mg total) by mouth daily., Disp: 90 tablet, Rfl: 3  Past Medical History: Past Medical History:  Diagnosis Date  . Coronary artery disease    -small NSTEMI in setting of heat stroke 7/10 -Cath 50-60% LAD; 7/10 -ET.[name]: EF 71% no ischemia 7/10   . Hyperlipidemia    mixed  . Hypertension     Tobacco Use: Social  History   Tobacco Use  Smoking Status Former Smoker  Smokeless Tobacco Never Used    Labs: Recent [employer] for ITP Cardiac and Pulmonary Rehab Latest Ref Rng & Units 02/16/2019 05/02/2020 11/14/2020   Cholestrol 0 - 200 mg/dL 150 143 96   LDLCALC 0 - 99 mg/dL 44 77 23   HDL >40 mg/dL 36(L) 42 30(L)   Trlycerides <150 mg/dL 349(H) 139 217(H)   Hemoglobin A1c 4.8 - 5.6 % - - 5.5       Exercise Target Goals: Exercise Program Goal: Individual exercise prescription set using results from initial 6 min walk test and THRR while considering  patient's activity barriers and safety.   Exercise Prescription Goal: Initial exercise prescription builds to 30-45 minutes a day of aerobic activity, 2-3 days per week.  Home exercise guidelines will be given to patient during program as part of exercise prescription that the participant will acknowledge.   Education: Aerobic Exercise: - Group verbal and visual presentation on the components of exercise prescription. Introduces F.I.T.T principle from ACSM for exercise prescriptions.  Reviews F.I.T.T. principles of aerobic exercise including progression. Written material given at graduation.   Education: Resistance Exercise: - Group verbal and visual presentation on the components of exercise prescription. Introduces F.I.T.T principle from ACSM for exercise prescriptions  Reviews F.I.T.T. principles of resistance exercise including progression. Written material given at  graduation.    Education: Exercise & Equipment Safety: - Individual verbal instruction and demonstration of equipment use and safety with use of the equipment. Flowsheet Row Cardiac Rehab from 11/29/2020 in [facility] Cardiac and Pulmonary Rehab  Education need identified 11/23/20  Date 11/23/20  Educator Hubbell  Instruction Review Code 1- Verbalizes Understanding      Education: Exercise Physiology & General Exercise Guidelines: - Group verbal and written instruction  with models to review the exercise physiology of the cardiovascular system and associated critical values. Provides general exercise guidelines with specific guidelines to those with heart or lung disease.    Education: Flexibility, Balance, Mind/Body Relaxation: - Group verbal and visual presentation with interactive activity on the components of exercise prescription. Introduces F.I.T.T principle from ACSM for exercise prescriptions. Reviews F.I.T.T. principles of flexibility and balance exercise training including progression. Also discusses the mind body connection.  Reviews various relaxation techniques to help reduce and manage stress (i.e. Deep breathing, progressive muscle relaxation, and visualization). Balance handout provided to take home. Written material given at graduation.   Activity Barriers & Risk Stratification:  Activity Barriers & Cardiac Risk Stratification - 11/23/20 1156      Activity Barriers & Cardiac Risk Stratification   Activity Barriers Other (comment)    Comments left elbow hurts    Cardiac Risk Stratification Moderate           6 Minute Walk:  6 Minute Walk    Row Name 11/23/20 1145         6 Minute Walk   Phase Initial     Distance 1760 feet     Walk Time 6 minutes     # of Rest Breaks 0     MPH 3.33     METS 4.97     RPE 11     Perceived Dyspnea  1     VO2 Peak 17.4     Symptoms Yes (comment)     Comments Slight SOB     Resting HR 71 bpm     Resting BP 114/74     Resting Oxygen Saturation  99 %     Exercise Oxygen Saturation  during 6 min walk 100 %     Max Ex. HR 107 bpm     Max Ex. BP 124/78     2 Minute Post BP 112/78            Oxygen Initial Assessment:   Oxygen Re-Evaluation:   Oxygen Discharge (Final Oxygen Re-Evaluation):   Initial Exercise Prescription:  Initial Exercise Prescription - 11/23/20 1300      Date of Initial Exercise RX and Referring Provider   Date 11/23/20    Referring Provider Glori Bickers MD       Treadmill   MPH 3.1    Grade 2    Minutes 15    METs 4.23      Recumbant Bike   Level 4    RPM 50    Watts 45    Minutes 15    METs 4.9      NuStep   Level 4    SPM 80    Minutes 15    METs 4.9      REL-XR   Level 4    Speed 50    Minutes 15    METs 4.9      Track   Laps 45    Minutes 15    METs 3.4      Prescription Details  Frequency (times per week) 3    Duration Progress to 30 minutes of continuous aerobic without signs/symptoms of physical distress      Intensity   THRR 40-80% of Max Heartrate 112-154    Ratings of Perceived Exertion 11-13    Perceived Dyspnea 0-4      Progression   Progression Continue to progress workloads to maintain intensity without signs/symptoms of physical distress.      Resistance Training   Training Prescription Yes    Weight 6 lb    Reps 10-15           Perform Capillary Blood Glucose checks as needed.  Exercise Prescription Changes:  Exercise Prescription Changes    Row Name 11/23/20 1300 11/28/20 1400           Response to Exercise   Blood Pressure (Admit) 114/74 120/78      Blood Pressure (Exercise) 124/78 132/76      Blood Pressure (Exit) 112/78 94/62      Heart Rate (Admit) 71 bpm 104 bpm      Heart Rate (Exercise) 107 bpm 136 bpm      Heart Rate (Exit) 68 bpm 98 bpm      Oxygen Saturation (Admit) 99 % --      Oxygen Saturation (Exercise) 100 % --      Oxygen Saturation (Exit) 99 % --      Rating of Perceived Exertion (Exercise) 11 --      Perceived Dyspnea (Exercise) 1 --      Symptoms Slight SOB --      Comments walk test results second day             Resistance Training   Training Prescription -- Yes      Weight -- 6 lb      Reps -- 10-15             Treadmill   MPH -- 3.5      Grade -- 3      Minutes -- 15      METs -- 5.13             REL-XR   Level -- 4      Minutes -- 15      METs -- 5.2             Exercise Comments:  Exercise Comments    Row Name 11/24/20 0834            Exercise Comments First full day of exercise!  Patient was oriented to gym and equipment including functions, settings, policies, and procedures.  Patient's individual exercise prescription and treatment plan were reviewed.  All starting workloads were established based on the results of the 6 minute walk test done at initial orientation visit.  The plan for exercise progression was also introduced and progression will be customized based on patient's performance and goals.              Exercise Goals and Review:  Exercise Goals    Row Name 11/23/20 1401             Exercise Goals   Increase Physical Activity Yes       Intervention Provide advice, education, support and counseling about physical activity/exercise needs.;Develop an individualized exercise prescription for aerobic and resistive training based on initial evaluation findings, risk stratification, comorbidities and participant's personal goals.       Expected Outcomes Short Term: Attend rehab on a regular basis to  increase amount of physical activity.;Long Term: Add in home exercise to make exercise part of routine and to increase amount of physical activity.;Long Term: Exercising regularly at least 3-5 days a week.       Increase Strength and Stamina Yes       Intervention Provide advice, education, support and counseling about physical activity/exercise needs.;Develop an individualized exercise prescription for aerobic and resistive training based on initial evaluation findings, risk stratification, comorbidities and participant's personal goals.       Expected Outcomes Short Term: Perform resistance training exercises routinely during rehab and add in resistance training at home;Short Term: Increase workloads from initial exercise prescription for resistance, speed, and METs.;Long Term: Improve cardiorespiratory fitness, muscular endurance and strength as measured by increased METs and functional capacity (6MWT)        Able to understand and use rate of perceived exertion (RPE) scale Yes       Intervention Provide education and explanation on how to use RPE scale       Expected Outcomes Short Term: Able to use RPE daily in rehab to express subjective intensity level;Long Term:  Able to use RPE to guide intensity level when exercising independently       Able to understand and use Dyspnea scale Yes       Intervention Provide education and explanation on how to use Dyspnea scale       Expected Outcomes Long Term: Able to use Dyspnea scale to guide intensity level when exercising independently;Short Term: Able to use Dyspnea scale daily in rehab to express subjective sense of shortness of breath during exertion       Knowledge and understanding of Target Heart Rate Range (THRR) Yes       Intervention Provide education and explanation of THRR including how the numbers were predicted and where they are located for reference       Expected Outcomes Short Term: Able to state/look up THRR;Short Term: Able to use daily as guideline for intensity in rehab;Long Term: Able to use THRR to govern intensity when exercising independently       Able to check pulse independently Yes       Intervention Provide education and demonstration on how to check pulse in carotid and radial arteries.;Review the importance of being able to check your own pulse for safety during independent exercise       Expected Outcomes Short Term: Able to explain why pulse checking is important during independent exercise;Long Term: Able to check pulse independently and accurately       Understanding of Exercise Prescription Yes       Intervention Provide education, explanation, and written materials on patient's individual exercise prescription       Expected Outcomes Long Term: Able to explain home exercise prescription to exercise independently;Short Term: Able to explain program exercise prescription              Exercise Goals Re-Evaluation :   Exercise Goals Re-Evaluation    Row Name 11/24/20 0836 11/28/20 1441 11/29/20 1202         Exercise Goal Re-Evaluation   Exercise Goals Review Able to understand and use rate of perceived exertion (RPE) scale;Knowledge and understanding of Target Heart Rate Range (THRR);Able to understand and use Dyspnea scale;Understanding of Exercise Prescription Increase Physical Activity;Increase Strength and Stamina Increase Physical Activity;Increase Strength and Stamina     Comments Reviewed RPE and dyspnea scales, THR and program prescription with pt today.  Pt voiced understanding and was  given a copy of goals to take home. Beatrice has tolerated exercise well in first 2 sessions.  Staff will monitor progress. Reviewed home exercise with pt today.  Pt plans towalk and use gym at [facility] for exercise.  Reviewed THR, pulse, RPE, sign and symptoms, pulse oximetery and when to call 911 or MD.  Also discussed weather considerations and indoor options.  Pt voiced understanding.     Expected Outcomes Short: Use RPE daily to regulate intensity. Long: Follow program prescription in THR. Short:  attend consistently Long:  improve overall stamina Short: monitor HR while exercising Long:  increase overall stamina            Discharge Exercise Prescription (Final Exercise Prescription Changes):  Exercise Prescription Changes - 11/28/20 1400      Response to Exercise   Blood Pressure (Admit) 120/78    Blood Pressure (Exercise) 132/76    Blood Pressure (Exit) 94/62    Heart Rate (Admit) 104 bpm    Heart Rate (Exercise) 136 bpm    Heart Rate (Exit) 98 bpm    Comments second day      Resistance Training   Training Prescription Yes    Weight 6 lb    Reps 10-15      Treadmill   MPH 3.5    Grade 3    Minutes 15    METs 5.13      REL-XR   Level 4    Minutes 15    METs 5.2           Nutrition:  Target Goals: Understanding of nutrition guidelines, daily intake of sodium <1534m, cholesterol <208m calories  30% from fat and 7% or less from saturated fats, daily to have 5 or more servings of fruits and vegetables.  Education: All About Nutrition: -Group instruction provided by verbal, written material, interactive activities, discussions, models, and posters to present general guidelines for heart healthy nutrition including fat, fiber, MyPlate, the role of sodium in heart healthy nutrition, utilization of the nutrition label, and utilization of this knowledge for meal planning. Follow up email sent as well. Written material given at graduation. [facility]  Date 11/29/20  Educator [facility] Hospital[facility]ruction Review Code 1- Verbalizes Understanding      Biometrics:  Pre Biometrics - 11/23/20 1156      Pre Biometrics   Height 5' 10.5" (1.791 m)    Weight 212 lb (96.2 kg)    BMI (Calculated) 29.98    Single Leg Stand 30 seconds            Nutrition Therapy Plan and Nutrition Goals:  Nutrition Therapy & Goals - 12/04/20 0816      Nutrition Therapy   Diet Heart healthy, low Na    Protein (specify units) 75g    Fiber 30 grams    Whole Grain Foods 3 servings    Saturated Fats 12 max. grams    Fruits and Vegetables 8 servings/day    Sodium 1.5 grams      Personal Nutrition Goals   Nutrition Goal ST: limit red meat to 1x/week, eat a CHO at every meal LT: limit red meat a couple times per month, increase food variety (starchy vegetables, beans, all the colors of fruits/vegetables in a week)    Comments V8 (low Na) L: sandwich (whole wheat) or go out to eat (slaad with grilled chicken) D: steak or pork or grilled chicken or ground tu[place]uwaitr  salmon and salad, sometimes pasta, squash and zucchini and onions as well as brussells sprouts. They have quinoa and sometimes cauliflower rice. Usually canola oil. Reviewed heart healthy eating.      Intervention Plan   Intervention Prescribe, educate and counsel regarding individualized specific  dietary modifications aiming towards targeted core components such as weight, hypertension, lipid management, diabetes, heart failure and other comorbidities.;Nutrition handout(s) given to patient.    Expected Outcomes Short Term Goal: Understand basic principles of dietary content, such as calories, fat, sodium, cholesterol and nutrients.;Short Term Goal: A plan has been developed with personal nutrition goals set during dietitian appointment.;Long Term Goal: Adherence to prescribed nutrition plan.           Nutrition Assessments:  MEDIFICTS Score Key:  ?70 Need to make dietary changes   40-70 Heart Healthy Diet  ? 40 Therapeutic Level Cholesterol Diet  Flowsheet Row Cardiac Rehab from 11/23/2020 in [facility] Cardiac and Pulmonary Rehab  Picture Your Plate Total Score on Admission 63     Picture Your Plate Scores:  <93 Unhealthy dietary pattern with much room for improvement.  41-50 Dietary pattern unlikely to meet recommendations for good health and room for improvement.  51-60 More healthful dietary pattern, with some room for improvement.   >60 Healthy dietary pattern, although there may be some specific behaviors that could be improved.    Nutrition Goals Re-Evaluation:   Nutrition Goals Discharge (Final Nutrition Goals Re-Evaluation):   Psychosocial: Target Goals: Acknowledge presence or absence of significant depression and/or stress, maximize coping skills, provide positive support system. Participant is able to verbalize types and ability to use techniques and skills needed for reducing stress and depression.   Education: Stress, Anxiety, and Depression - Group verbal and visual presentation to define topics covered.  Reviews how body is impacted by stress, anxiety, and depression.  Also discusses healthy ways to reduce stress and to treat/manage anxiety and depression.  Written material given at graduation.   Education: Sleep Hygiene -Provides group verbal and written  instruction about how sleep can affect your health.  Define sleep hygiene, discuss sleep cycles and impact of sleep habits. Review good sleep hygiene tips.    Initial Review & Psychosocial Screening:  Initial Psych Review & Screening - 11/22/20 1536      Initial Review   Current issues with Current Stress Concerns    Source of Stress Concerns Unable to participate in former interests or hobbies      [place]? Yes   wife     Barriers   Psychosocial barriers to participate in program There are no identifiable barriers or psychosocial needs.      Screening Interventions   Expected Outcomes Short Term goal: Utilizing psychosocial counselor, staff and physician to assist with identification of specific Stressors or current issues interfering with healing process. Setting desired goal for each stressor or current issue identified.;Long Term Goal: Stressors or current issues are controlled or eliminated.;Short Term goal: Identification and review with participant of any Quality of Life or Depression concerns found by scoring the questionnaire.;Long Term goal: The participant improves quality of Life and PHQ9 Scores as seen by post scores and/or verbalization of changes           Quality of Life Scores:   Quality of Life - 11/23/20 1204      Quality of Life   Select Quality of Life      Quality of Life Scores   Health/Function Pre 25.6 %  Socioeconomic Pre 21.75 %    Psych/Spiritual Pre 24 %    Family Pre 30 %    GLOBAL Pre 25.03 %          Scores of 19 and below usually indicate a poorer quality of life in these areas.  A difference of  2-3 points is a clinically meaningful difference.  A difference of 2-3 points in the total score of the Quality of Life Index has been associated with significant improvement in overall quality of life, self-image, physical symptoms, and general health in studies assessing change in quality of life.  PHQ-9: Recent  Review Flowsheet Data    Depression screen [facility] 2/9 11/23/2020   Decreased Interest 1   Down, Depressed, Hopeless 1   PHQ - 2 Score 2   Altered sleeping 0   Tired, decreased energy 1   Change in appetite 1   Feeling bad or failure about yourself  1   Trouble concentrating 1   Moving slowly or fidgety/restless 0   Suicidal thoughts 0   PHQ-9 Score 6   Difficult doing work/chores Not difficult at all     Interpretation of Total Score  Total Score Depression Severity:  1-4 = Minimal depression, 5-9 = Mild depression, 10-14 = Moderate depression, 15-19 = Moderately severe depression, 20-27 = Severe depression   Psychosocial Evaluation and Intervention:  Psychosocial Evaluation - 11/22/20 1543      Psychosocial Evaluation & Interventions   Interventions Encouraged to exercise with the program and follow exercise prescription    Comments Consuelo reports feeling better after his stent. His symptoms started around Thanksgiving so he is thankful to be feeling better. Prior to his initial symptoms, he was preparing to run a half marathon and since his symptoms started he has become more sedentary. He is looking forward to getting back into an exercise routine with the help and supervision of the cardiac rehab staff. 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 His wife and kids are his main support system. He is excited to start the program as he is ready to get back to his hobbies without having his anginal and shortness of breath symptoms.    Expected Outcomes Short: attend cardiac rehab for education and exercise. Long: develop and maintain positive self care habits.    Continue Psychosocial Services  Follow up required by staff           Psychosocial Re-Evaluation:   Psychosocial Discharge (Final Psychosocial Re-Evaluation):   Vocational Rehabilitation: Provide vocational rehab assistance to qualifying candidates.   Vocational Rehab Evaluation &  Intervention:  Vocational Rehab - 11/22/20 1536      Initial Vocational Rehab Evaluation & Intervention   Assessment shows need for Vocational Rehabilitation No           Education: Education Goals: Education classes will be provided on a variety of topics geared toward better understanding of heart health and risk factor modification. Participant will state understanding/return demonstration of topics presented as noted by education test scores.  Learning Barriers/Preferences:  Learning Barriers/Preferences - 11/22/20 1536      Learning Barriers/Preferences   Learning Barriers None    Learning Preferences None           General Cardiac Education Topics:  AED/CPR: - Group verbal and written instruction with the use of models to demonstrate the basic use of the AED with the basic ABC's of resuscitation.   Anatomy  and Cardiac Procedures: - Group verbal and visual presentation and models provide information about basic cardiac anatomy and function. Reviews the testing methods done to diagnose heart disease and the outcomes of the test results. Describes the treatment choices: Medical Management, Angioplasty, or Coronary Bypass Surgery for treating various heart conditions including Myocardial Infarction, Angina, Valve Disease, and Cardiac Arrhythmias.  Written material given at graduation.   Medication Safety: - Group verbal and visual instruction to review commonly prescribed medications for heart and lung disease. Reviews the medication, class of the drug, and side effects. Includes the steps to properly store meds and maintain the prescription regimen.  Written material given at graduation.   Intimacy: - Group verbal instruction through game format to discuss how heart and lung disease can affect sexual intimacy. Written material given at graduation..   Know Your Numbers and Heart Failure: - Group verbal and visual instruction to discuss disease risk factors for cardiac and  pulmonary disease and treatment options.  Reviews associated critical values for Overweight/Obesity, Hypertension, Cholesterol, and Diabetes.  Discusses basics of heart failure: signs/symptoms and treatments.  Introduces Heart Failure Zone chart for action plan for heart failure.  Written material given at graduation.   Infection Prevention: - Provides verbal and written material to individual with discussion of infection control including proper hand washing and proper equipment cleaning during exercise session. Flowsheet Row Cardiac Rehab from 11/29/2020 in [facility] Cardiac and Pulmonary Rehab  Education need identified 11/23/20  Date 11/23/20  Educator Glenn  Instruction Review Code 1- Verbalizes Understanding      Falls Prevention: - Provides verbal and written material to individual with discussion of falls prevention and safety. Flowsheet Row Cardiac Rehab from 11/29/2020 in [facility] Cardiac and Pulmonary Rehab  Education need identified 11/23/20  Date 11/23/20  Educator [place]  Instruction Review Code 1- Verbalizes Understanding      Other: -Provides group and verbal instruction on various topics (see comments)   Knowledge Questionnaire Score:  Knowledge Questionnaire Score - 11/23/20 1203      Knowledge Questionnaire Score   Pre Score 23/26: Nitro, nutrition           Core Components/Risk Factors/Patient Goals at Admission:  Personal Goals and Risk Factors at Admission - 11/23/20 1401      Core Components/Risk Factors/Patient Goals on Admission    Weight Management Yes;Weight Loss    Intervention Weight Management: Develop a combined nutrition and exercise program designed to reach desired caloric intake, while maintaining appropriate intake of nutrient and fiber, sodium and fats, and appropriate energy expenditure required for the weight goal.;Weight Management: Provide education and appropriate resources to help participant work on and attain dietary goals.;Weight  Management/Obesity: Establish reasonable short term and long term weight goals.    Admit Weight 212 lb (96.2 kg)    Goal Weight: Short Term 207 lb (93.9 kg)    Goal Weight: Long Term 190 lb (86.2 kg)    Expected Outcomes Short Term: Continue to assess and modify interventions until short term weight is achieved;Long Term: Adherence to nutrition and physical activity/exercise program aimed toward attainment of established weight goal;Weight Loss: Understanding of general recommendations for a balanced deficit meal plan, which promotes 1-2 lb weight loss per week and includes a negative energy balance of [phone] kcal/d;Understanding recommendations for meals to include 15-35% energy as protein, 25-35% energy from fat, 35-60% energy from carbohydrates, less than 229m of dietary cholesterol, 20-35 gm of total fiber daily;Understanding of distribution of calorie intake throughout the day with  the consumption of 4-5 meals/snacks    Hypertension Yes    Intervention Provide education on lifestyle modifcations including regular physical activity/exercise, weight management, moderate sodium restriction and increased consumption of fresh fruit, vegetables, and low fat dairy, alcohol moderation, and smoking cessation.;Monitor prescription use compliance.    Expected Outcomes Short Term: Continued assessment and intervention until BP is < 140/20m HG in hypertensive participants. < 130/817mHG in hypertensive participants with diabetes, heart failure or chronic kidney disease.;Long Term: Maintenance of blood pressure at goal levels.    Lipids Yes    Intervention Provide education and support for participant on nutrition & aerobic/resistive exercise along with prescribed medications to achieve LDL <7065mHDL >87m35m  Expected Outcomes Long Term: Cholesterol controlled with medications as prescribed, with individualized exercise RX and with personalized nutrition plan. Value goals: LDL < 70mg67mL > 40 mg.;Short Term:  Participant states understanding of desired cholesterol values and is compliant with medications prescribed. Participant is following exercise prescription and nutrition guidelines.           Education:Diabetes - Individual verbal and written instruction to review signs/symptoms of diabetes, desired ranges of glucose level fasting, after meals and with exercise. Acknowledge that pre and post exercise glucose checks will be done for 3 sessions at entry of program.   Core Components/Risk Factors/Patient Goals Review:    Core Components/Risk Factors/Patient Goals at Discharge (Final Review):    ITP Comments:  ITP Comments    Row Name 11/22/20 1540 11/23/20 0948 11/24/20 0834 12/06/20 0714     ITP Comments Initial telephone orientation completed. [facility]. EP orientation scheduled for Thursday 5/5 at 8am. Completed 6MWT and gym orientation. Initial ITP created and sent for review to Dr. Mark Emily Filbertical Director. First full day of exercise!  Patient was oriented to gym and equipment including functions, settings, policies, and procedures.  Patient's individual exercise prescription and treatment plan were reviewed.  All starting workloads were established based on the results of the 6 minute walk test done at initial orientation visit.  The plan for exercise progression was also introduced and progression will be customized based on patient's performance and goals. 30 Day review completed. Medical Director ITP review done, changes made as directed, and signed approval by Medical Director.  New to program           Comments:

## 2020-12-07 ENCOUNTER — Other Ambulatory Visit: Payer: Self-pay

## 2020-12-07 DIAGNOSIS — Z955 Presence of coronary angioplasty implant and graft: Secondary | ICD-10-CM

## 2020-12-07 NOTE — Progress Notes (Signed)
Daily Session Note  Patient Details  Name: Lance Terry MRN: [id number] Date of Birth: 01/18/1976 Referring Provider:   Flowsheet Row Cardiac Rehab from 11/23/2020 in [facility] Cardiac and Pulmonary Rehab  Referring Provider Bensimhon, Daniel MD      Encounter Date: 12/07/2020  Check In:  Session Check In - 12/07/20 0716      Check-In   Supervising physician immediately available to respond to emergencies See telemetry face sheet for immediately available ER MD    Location [facility]-Cardiac & Pulmonary Rehab    Staff Present Kelly Bollinger, MPA, RN;Kelly Hayes, BS, ACSM CEP, Exercise Physiologist;Jessica Hawkins, MA, RCEP, CCRP, CCET    Virtual Visit No    Medication changes reported     No    Fall or balance concerns reported    No    Warm-up and Cool-down Performed on first and last piece of equipment    Resistance Training Performed Yes    VAD Patient? No    PAD/SET Patient? No      Pain Assessment   Currently in Pain? No/denies              Social History   Tobacco Use  Smoking Status Former Smoker  Smokeless Tobacco Never Used    Goals Met:  Independence with exercise equipment Exercise tolerated well No report of cardiac concerns or symptoms Strength training completed today  Goals Unmet:  Not Applicable  Comments: Pt able to follow exercise prescription today without complaint.  Will continue to monitor for progression.    Dr. Mark Miller is Medical Director for [facility] and [facility]. 

## 2020-12-12 ENCOUNTER — Other Ambulatory Visit: Payer: Self-pay

## 2020-12-12 DIAGNOSIS — Z955 Presence of coronary angioplasty implant and graft: Secondary | ICD-10-CM

## 2020-12-12 NOTE — Progress Notes (Signed)
Daily Session Note  Patient Details  Name: RASHAAN WYLES MRN: [id number] Date of Birth: 08/01/1975 Referring Provider:   Flowsheet Row Cardiac Rehab from 11/23/2020 in [facility] Cardiac and Pulmonary Rehab  Referring Provider Glori Bickers MD      Encounter Date: 12/12/2020  Check In:  Session Check In - 12/12/20 0731      Check-In   Supervising physician immediately available to respond to emergencies See telemetry face sheet for immediately available ER MD    Location [facility]-Cardiac & Pulmonary Rehab    Staff Present Birdie Sons, MPA, RN;Amanda Oletta Darter, BA, ACSM CEP, Exercise Physiologist;Kara Eliezer Bottom, MS, ASCM CEP, Exercise Physiologist    Virtual Visit No    Medication changes reported     No    Fall or balance concerns reported    No    Warm-up and Cool-down Performed on first and last piece of equipment    Resistance Training Performed Yes    VAD Patient? No    PAD/SET Patient? No      Pain Assessment   Currently in Pain? No/denies              Social History   Tobacco Use  Smoking Status Former Smoker  Smokeless Tobacco Never Used    Goals Met:  Independence with exercise equipment Exercise tolerated well No report of cardiac concerns or symptoms Strength training completed today  Goals Unmet:  Not Applicable  Comments: Pt able to follow exercise prescription today without complaint.  Will continue to monitor for progression.    Dr. Emily Filbert is Medical Director for [place].  Dr. Ottie Glazier is Medical Director for [facility] Pulmonary Rehabilitation.

## 2020-12-13 DIAGNOSIS — Z955 Presence of coronary angioplasty implant and graft: Secondary | ICD-10-CM | POA: Diagnosis not present

## 2020-12-13 NOTE — Progress Notes (Signed)
Daily Session Note  Patient Details  Name: Lance Terry MRN: [id number] Date of Birth: [birth year] Referring Provider:   Flowsheet Row Cardiac Rehab from 11/23/2020 in [facility] Cardiac and Pulmonary Rehab  Referring Provider Glori Bickers MD      Encounter Date: 12/13/2020  Check In:  Session Check In - 12/13/20 0733      Check-In   Supervising physician immediately available to respond to emergencies See telemetry face sheet for immediately available ER MD    Location [facility]-Cardiac & Pulmonary Rehab    Staff Present Birdie Sons, MPA, Elveria Rising, BA, ACSM CEP, Exercise Physiologist;Joseph Tessie Fass RCP,RRT,BSRT    Virtual Visit No    Medication changes reported     No    Fall or balance concerns reported    No    Warm-up and Cool-down Performed on first and last piece of equipment    Resistance Training Performed Yes    VAD Patient? No    PAD/SET Patient? No      Pain Assessment   Currently in Pain? No/denies              Social History   Tobacco Use  Smoking Status Former Smoker  Smokeless Tobacco Never Used    Goals Met:  Independence with exercise equipment Exercise tolerated well No report of cardiac concerns or symptoms Strength training completed today  Goals Unmet:  Not Applicable  Comments: Pt able to follow exercise prescription today without complaint.  Will continue to monitor for progression.    Dr. Emily Filbert is Medical Director for [place].  Dr. Ottie Glazier is Medical Director for [facility] Pulmonary Rehabilitation.

## 2020-12-14 ENCOUNTER — Other Ambulatory Visit: Payer: Self-pay

## 2020-12-14 DIAGNOSIS — Z955 Presence of coronary angioplasty implant and graft: Secondary | ICD-10-CM | POA: Diagnosis not present

## 2020-12-14 NOTE — Progress Notes (Signed)
Daily Session Note  Patient Details  Name: Lance Terry MRN: [id number] Date of Birth: 18-Aug-1975 Referring Provider:   Flowsheet Row Cardiac Rehab from 11/23/2020 in [facility] Cardiac and Pulmonary Rehab  Referring Provider Glori Bickers MD      Encounter Date: 12/14/2020  Check In:  Session Check In - 12/14/20 0717      Check-In   Supervising physician immediately available to respond to emergencies See telemetry face sheet for immediately available ER MD    Location [facility]-Cardiac & Pulmonary Rehab    Staff Present Birdie Sons, MPA, RN;Amanda Oletta Darter, BA, ACSM CEP, Exercise Physiologist;Melissa Caiola RDN, LDN    Virtual Visit No    Medication changes reported     No    Fall or balance concerns reported    No    Warm-up and Cool-down Performed on first and last piece of equipment    Resistance Training Performed Yes    VAD Patient? No    PAD/SET Patient? No      Pain Assessment   Currently in Pain? No/denies              Social History   Tobacco Use  Smoking Status Former Smoker  Smokeless Tobacco Never Used    Goals Met:  Independence with exercise equipment Exercise tolerated well No report of cardiac concerns or symptoms Strength training completed today  Goals Unmet:  Not Applicable  Comments: Pt able to follow exercise prescription today without complaint.  Will continue to monitor for progression.    Dr. Emily Filbert is Medical Director for [place].  Dr. Ottie Glazier is Medical Director for [facility] Pulmonary Rehabilitation.

## 2020-12-25 ENCOUNTER — Encounter: Payer: BC Managed Care – PPO | Attending: Internal Medicine | Admitting: *Deleted

## 2020-12-25 ENCOUNTER — Other Ambulatory Visit: Payer: Self-pay

## 2020-12-25 DIAGNOSIS — Z955 Presence of coronary angioplasty implant and graft: Secondary | ICD-10-CM | POA: Insufficient documentation

## 2020-12-25 NOTE — Progress Notes (Signed)
Daily Session Note  Patient Details  Name: Lance Terry MRN: [id number] Date of Birth: 08/26/1975 Referring Provider:   Flowsheet Row Cardiac Rehab from 11/23/2020 in [facility] Cardiac and Pulmonary Rehab  Referring Provider Bensimhon, Daniel MD      Encounter Date: 12/25/2020  Check In:  Session Check In - 12/25/20 1001      Check-In   Supervising physician immediately available to respond to emergencies See telemetry face sheet for immediately available ER MD    Location [facility]-Cardiac & Pulmonary Rehab    Staff Present Susanne Bice, RN, BSN, CCRP;Joseph Hood RCP,RRT,BSRT;Kelly Hayes, BS, ACSM CEP, Exercise Physiologist    Virtual Visit No    Medication changes reported     No    Fall or balance concerns reported    No    Warm-up and Cool-down Performed on first and last piece of equipment    Resistance Training Performed Yes    VAD Patient? No    PAD/SET Patient? No      Pain Assessment   Currently in Pain? No/denies              Social History   Tobacco Use  Smoking Status Former Smoker  Smokeless Tobacco Never Used    Goals Met:  Independence with exercise equipment Exercise tolerated well No report of cardiac concerns or symptoms  Goals Unmet:  Not Applicable  Comments:Pt able to follow exercise prescription today without complaint.  Will continue to monitor for progression.   Dr. Mark Miller is Medical Director for [facility].  Dr. Fuad Aleskerov is Medical Director for [facility]. 

## 2020-12-26 ENCOUNTER — Other Ambulatory Visit: Payer: Self-pay

## 2020-12-26 DIAGNOSIS — Z955 Presence of coronary angioplasty implant and graft: Secondary | ICD-10-CM

## 2020-12-26 NOTE — Progress Notes (Signed)
Daily Session Note  Patient Details  Name: Jomar M Sebastiani MRN: [id number] Date of Birth: 03/27/1976 Referring Provider:   Flowsheet Row Cardiac Rehab from 11/23/2020 in [facility] Cardiac and Pulmonary Rehab  Referring Provider Bensimhon, Daniel MD      Encounter Date: 12/26/2020  Check In:  Session Check In - 12/26/20 0720      Check-In   Supervising physician immediately available to respond to emergencies See telemetry face sheet for immediately available ER MD    Location [facility]-Cardiac & Pulmonary Rehab    Staff Present Kelly Bollinger, MPA, RN;Amanda Sommer, BA, ACSM CEP, Exercise Physiologist;Kara Langdon, MS, ASCM CEP, Exercise Physiologist    Virtual Visit No    Medication changes reported     No    Fall or balance concerns reported    No    Warm-up and Cool-down Performed on first and last piece of equipment    Resistance Training Performed Yes    VAD Patient? No    PAD/SET Patient? No      Pain Assessment   Currently in Pain? No/denies              Social History   Tobacco Use  Smoking Status Former Smoker  Smokeless Tobacco Never Used    Goals Met:  Independence with exercise equipment Exercise tolerated well No report of cardiac concerns or symptoms Strength training completed today  Goals Unmet:  Not Applicable  Comments: Pt able to follow exercise prescription today without complaint.  Will continue to monitor for progression.    Dr. Mark Miller is Medical Director for [facility].  Dr. Fuad Aleskerov is Medical Director for [facility]. 

## 2020-12-28 ENCOUNTER — Other Ambulatory Visit: Payer: Self-pay

## 2020-12-28 DIAGNOSIS — Z955 Presence of coronary angioplasty implant and graft: Secondary | ICD-10-CM | POA: Diagnosis not present

## 2020-12-28 NOTE — Progress Notes (Signed)
Daily Session Note  Patient Details  Name: Lance Terry MRN: [id number] Date of Birth: July 02, 1976 Referring Provider:   Flowsheet Row Cardiac Rehab from 11/23/2020 in [facility] Cardiac and Pulmonary Rehab  Referring Provider Glori Bickers MD       Encounter Date: 12/28/2020  Check In:  Session Check In - 12/28/20 0721       Check-In   Supervising physician immediately available to respond to emergencies See telemetry face sheet for immediately available ER MD    Location [facility]-Cardiac & Pulmonary Rehab    Staff Present Earlean Shawl, BS, ACSM CEP, Exercise Physiologist;Amanda Oletta Darter, BA, ACSM CEP, Exercise Physiologist;Codee Tutson Rosalia Hammers, MPA, RN    Virtual Visit No    Medication changes reported     No    Fall or balance concerns reported    No    Warm-up and Cool-down Performed on first and last piece of equipment    Resistance Training Performed Yes    VAD Patient? No    PAD/SET Patient? No      Pain Assessment   Currently in Pain? No/denies                Social History   Tobacco Use  Smoking Status Former   Pack years: 0.00  Smokeless Tobacco Never    Goals Met:  Independence with exercise equipment Exercise tolerated well No report of cardiac concerns or symptoms Strength training completed today  Goals Unmet:  Not Applicable  Comments: Pt able to follow exercise prescription today without complaint.  Will continue to monitor for progression.    Dr. Emily Filbert is Medical Director for [place].  Dr. Ottie Glazier is Medical Director for [facility] Pulmonary Rehabilitation.

## 2021-01-01 ENCOUNTER — Other Ambulatory Visit: Payer: Self-pay

## 2021-01-01 ENCOUNTER — Encounter: Payer: BC Managed Care – PPO | Admitting: *Deleted

## 2021-01-01 DIAGNOSIS — Z955 Presence of coronary angioplasty implant and graft: Secondary | ICD-10-CM | POA: Diagnosis not present

## 2021-01-01 NOTE — Progress Notes (Signed)
Daily Session Note  Patient Details  Name: Lance Terry MRN: [id number] Date of Birth: Oct 01, 1975 Referring Provider:   Flowsheet Row Cardiac Rehab from 11/23/2020 in [facility] Cardiac and Pulmonary Rehab  Referring Provider Glori Bickers MD       Encounter Date: 01/01/2021  Check In:  Session Check In - 01/01/21 0901       Check-In   Supervising physician immediately available to respond to emergencies See telemetry face sheet for immediately available ER MD    Location [facility]-Cardiac & Pulmonary Rehab    Staff Present Heath Lark, RN, BSN, CCRP;Joseph Hood RCP,RRT,BSRT;Kelly Shelly, [place], ACSM CEP, Exercise Physiologist    Virtual Visit No    Medication changes reported     No    Fall or balance concerns reported    No    Warm-up and Cool-down Performed on first and last piece of equipment    Resistance Training Performed Yes    VAD Patient? No    PAD/SET Patient? No      Pain Assessment   Currently in Pain? No/denies                Social History   Tobacco Use  Smoking Status Former   Pack years: 0.00  Smokeless Tobacco Never    Goals Met:  Independence with exercise equipment Exercise tolerated well No report of cardiac concerns or symptoms  Goals Unmet:  Not Applicable  Comments: Pt able to follow exercise prescription today without complaint.  Will continue to monitor for progression.    Dr. Emily Filbert is Medical Director for [place].  Dr. Ottie Glazier is Medical Director for [facility] Pulmonary Rehabilitation.

## 2021-01-02 ENCOUNTER — Other Ambulatory Visit: Payer: Self-pay

## 2021-01-02 DIAGNOSIS — Z955 Presence of coronary angioplasty implant and graft: Secondary | ICD-10-CM | POA: Diagnosis not present

## 2021-01-02 NOTE — Progress Notes (Signed)
Daily Session Note  Patient Details  Name: Lance Terry MRN: [id number] Date of Birth: 01-05-1976 Referring Provider:   Flowsheet Row Cardiac Rehab from 11/23/2020 in [facility] Cardiac and Pulmonary Rehab  Referring Provider Glori Bickers MD       Encounter Date: 01/02/2021  Check In:  Session Check In - 01/02/21 0732       Check-In   Supervising physician immediately available to respond to emergencies See telemetry face sheet for immediately available ER MD    Location [facility]-Cardiac & Pulmonary Rehab    Staff Present Birdie Sons, MPA, RN;Laureen Owens Shark, BS, RRT, CPFT;Kara Eliezer Bottom, MS, ASCM CEP, Exercise Physiologist    Virtual Visit No    Medication changes reported     No    Fall or balance concerns reported    No    Warm-up and Cool-down Performed on first and last piece of equipment    Resistance Training Performed Yes    VAD Patient? No    PAD/SET Patient? No      Pain Assessment   Currently in Pain? No/denies                Social History   Tobacco Use  Smoking Status Former   Pack years: 0.00  Smokeless Tobacco Never    Goals Met:  Independence with exercise equipment Exercise tolerated well No report of cardiac concerns or symptoms Strength training completed today  Goals Unmet:  Not Applicable  Comments: Pt able to follow exercise prescription today without complaint.  Will continue to monitor for progression.    Dr. Emily Filbert is Medical Director for [place].  Dr. Ottie Glazier is Medical Director for [facility] Pulmonary Rehabilitation.

## 2021-01-03 ENCOUNTER — Encounter: Payer: Self-pay | Admitting: *Deleted

## 2021-01-03 DIAGNOSIS — Z955 Presence of coronary angioplasty implant and graft: Secondary | ICD-10-CM

## 2021-01-03 NOTE — Progress Notes (Signed)
Cardiac Individual Treatment Plan  Patient Details  Name: Lance Terry MRN: [id number] Date of Birth: 02-02-1976 Referring Provider:   Flowsheet Row Cardiac Rehab from 11/23/2020 in [facility] Cardiac and Pulmonary Rehab  Referring Provider Glori Bickers MD       Initial Encounter Date:  Flowsheet Row Cardiac Rehab from 11/23/2020 in [facility] Cardiac and Pulmonary Rehab  Date 11/23/20       Visit Diagnosis: Status post coronary artery stent placement  Patient's Home Medications on Admission:  Current Outpatient Medications:    aspirin 81 MG tablet, Take 81 mg by mouth daily., Disp: , Rfl:    Cholecalciferol (VITAMIN D) 50 MCG (2000 UT) tablet, Take 4,000-6,000 Units by mouth daily., Disp: , Rfl:    clopidogrel (PLAVIX) 75 MG tablet, Take 1 tablet (75 mg total) by mouth daily., Disp: 30 tablet, Rfl: 6   ezetimibe (ZETIA) 10 MG tablet, Take 1 tablet (10 mg total) by mouth daily., Disp: 90 tablet, Rfl: 3   icosapent Ethyl (VASCEPA) 1 g capsule, Take 2 capsules (2 g total) by mouth 2 (two) times daily., Disp: 120 capsule, Rfl: 6   lisinopril (ZESTRIL) 20 MG tablet, TAKE 1 TABLET (20 MG TOTAL) BY MOUTH DAILY., Disp: 90 tablet, Rfl: 3   metoprolol succinate (TOPROL-XL) 25 MG 24 hr tablet, Take 1 tablet (25 mg total) by mouth daily., Disp: 90 tablet, Rfl: 3   Multiple Vitamins-Minerals (MULTIVITAL) tablet, Take 1 tablet by mouth daily., Disp: , Rfl:    nitroGLYCERIN (NITROSTAT) 0.4 MG SL tablet, Place 1 tablet (0.4 mg total) under the tongue every 5 (five) minutes as needed for chest pain., Disp: 25 tablet, Rfl: 3   rosuvastatin (CRESTOR) 40 MG tablet, Take 1 tablet (40 mg total) by mouth daily., Disp: 90 tablet, Rfl: 3  Past Medical History: Past Medical History:  Diagnosis Date   Coronary artery disease    -small NSTEMI in setting of heat stroke 7/10 -Cath 50-60% LAD; 7/10 -ET.[name]: EF 71% no ischemia 7/10    Hyperlipidemia    mixed   Hypertension     Tobacco Use: Social History    Tobacco Use  Smoking Status Former   Pack years: 0.00  Smokeless Tobacco Never    Labs: Recent Review Scientist, physiological     Labs for ITP Cardiac and Pulmonary Rehab Latest Ref Rng & Units 02/16/2019 05/02/2020 11/14/2020   Cholestrol 0 - 200 mg/dL 150 143 96   LDLCALC 0 - 99 mg/dL 44 77 23   HDL >40 mg/dL 36(L) 42 30(L)   Trlycerides <150 mg/dL 349(H) 139 217(H)   Hemoglobin A1c 4.8 - 5.6 % - - 5.5        Exercise Target Goals: Exercise Program Goal: Individual exercise prescription set using results from initial 6 min walk test and THRR while considering  patient's activity barriers and safety.   Exercise Prescription Goal: Initial exercise prescription builds to 30-45 minutes a day of aerobic activity, 2-3 days per week.  Home exercise guidelines will be given to patient during program as part of exercise prescription that the participant will acknowledge.   Education: Aerobic Exercise: - Group verbal and visual presentation on the components of exercise prescription. Introduces F.I.T.T principle from ACSM for exercise prescriptions.  Reviews F.I.T.T. principles of aerobic exercise including progression. Written material given at graduation.   Education: Resistance Exercise: - Group verbal and visual presentation on the components of exercise prescription. Introduces F.I.T.T principle from ACSM for exercise prescriptions  Reviews F.I.T.T. principles of resistance  exercise including progression. Written material given at graduation.    Education: Exercise & Equipment Safety: - Individual verbal instruction and demonstration of equipment use and safety with use of the equipment. Flowsheet Row Cardiac Rehab from 12/28/2020 in [facility] Cardiac and Pulmonary Rehab  Education need identified 11/23/20  Date 11/23/20  Educator [place]  Instruction Review Code 1- Verbalizes Understanding       Education: Exercise Physiology & General Exercise Guidelines: - Group verbal and written  instruction with models to review the exercise physiology of the cardiovascular system and associated critical values. Provides general exercise guidelines with specific guidelines to those with heart or lung disease.    Education: Flexibility, Balance, Mind/Body Relaxation: - Group verbal and visual presentation with interactive activity on the components of exercise prescription. Introduces F.I.T.T principle from ACSM for exercise prescriptions. Reviews F.I.T.T. principles of flexibility and balance exercise training including progression. Also discusses the mind body connection.  Reviews various relaxation techniques to help reduce and manage stress (i.e. Deep breathing, progressive muscle relaxation, and visualization). Balance handout provided to take home. Written material given at graduation.   Activity Barriers & Risk Stratification:  Activity Barriers & Cardiac Risk Stratification - 11/23/20 1156       Activity Barriers & Cardiac Risk Stratification   Activity Barriers Other (comment)    Comments left elbow hurts    Cardiac Risk Stratification Moderate             6 Minute Walk:  6 Minute Walk     Row Name 11/23/20 1145         6 Minute Walk   Phase Initial     Distance 1760 feet     Walk Time 6 minutes     # of Rest Breaks 0     MPH 3.33     METS 4.97     RPE 11     Perceived Dyspnea  1     VO2 Peak 17.4     Symptoms Yes (comment)     Comments Slight SOB     Resting HR 71 bpm     Resting BP 114/74     Resting Oxygen Saturation  99 %     Exercise Oxygen Saturation  during 6 min walk 100 %     Max Ex. HR 107 bpm     Max Ex. BP 124/78     2 Minute Post BP 112/78              Oxygen Initial Assessment:   Oxygen Re-Evaluation:   Oxygen Discharge (Final Oxygen Re-Evaluation):   Initial Exercise Prescription:  Initial Exercise Prescription - 11/23/20 1300       Date of Initial Exercise RX and Referring Provider   Date 11/23/20    Referring  Provider Glori Bickers MD      Treadmill   MPH 3.1    Grade 2    Minutes 15    METs 4.23      Recumbant Bike   Level 4    RPM 50    Watts 45    Minutes 15    METs 4.9      NuStep   Level 4    SPM 80    Minutes 15    METs 4.9      REL-XR   Level 4    Speed 50    Minutes 15    METs 4.9      Track   Laps 45  Minutes 15    METs 3.4      Prescription Details   Frequency (times per week) 3    Duration Progress to 30 minutes of continuous aerobic without signs/symptoms of physical distress      Intensity   THRR 40-80% of Max Heartrate 112-154    Ratings of Perceived Exertion 11-13    Perceived Dyspnea 0-4      Progression   Progression Continue to progress workloads to maintain intensity without signs/symptoms of physical distress.      Resistance Training   Training Prescription Yes    Weight 6 lb    Reps 10-15             Perform Capillary Blood Glucose checks as needed.  Exercise Prescription Changes:   Exercise Prescription Changes     Row Name 11/23/20 1300 11/28/20 1400 12/14/20 0800 12/27/20 1300       Response to Exercise   Blood Pressure (Admit) 114/74 120/78 108/60 112/68    Blood Pressure (Exercise) 124/78 132/76 134/58 138/68    Blood Pressure (Exit) 112/78 94/62 104/72 102/66    Heart Rate (Admit) 71 bpm 104 bpm 74 bpm 85 bpm    Heart Rate (Exercise) 107 bpm 136 bpm 140 bpm 129 bpm    Heart Rate (Exit) 68 bpm 98 bpm 94 bpm 71 bpm    Oxygen Saturation (Admit) 99 % -- -- --    Oxygen Saturation (Exercise) 100 % -- -- --    Oxygen Saturation (Exit) 99 % -- -- --    Rating of Perceived Exertion (Exercise) 11 -- 14 13    Perceived Dyspnea (Exercise) 1 -- -- --    Symptoms Slight SOB -- none none    Comments walk test results second day -- --    Duration -- -- Continue with 30 min of aerobic exercise without signs/symptoms of physical distress. Continue with 30 min of aerobic exercise without signs/symptoms of physical distress.     Intensity -- -- THRR unchanged THRR unchanged         Progression        Progression -- -- Continue to progress workloads to maintain intensity without signs/symptoms of physical distress. Continue to progress workloads to maintain intensity without signs/symptoms of physical distress.    Average METs -- -- 5.03 5.3         Resistance Training        Training Prescription -- Yes Yes Yes    Weight -- 6 lb 7 lb 7 lb    Reps -- 10-15 10-15 10-15         Interval Training        Interval Training -- -- Yes Yes    Equipment -- -- Treadmill Treadmill    Comments -- -- 1 min running intervals 1 min running intervals         Treadmill        MPH -- 3.5 4.3 3.5    Grade -- 3 2 4.5    Minutes -- _0 METs -- 5.13 6.8 5.85         NuStep        Level -- -- 5 --    Minutes -- -- 15 --    METs -- -- 3.6 --         REL-XR        Level -- _1 Minutes -- 15 15 15  METs -- 5.2 4.7 --         Home Exercise Plan        Plans to continue exercise at -- -- Home (comment)  walking and gym at [place] (comment)  walking and gym at [facility]    Frequency -- -- Add 2 additional days to program exercise sessions. Add 2 additional days to program exercise sessions.    Initial Home Exercises Provided -- -- 11/29/20 11/29/20            Exercise Comments:   Exercise Comments     Row Name 11/24/20 [id number]           Exercise Comments First full day of exercise!  Patient was oriented to gym and equipment including functions, settings, policies, and procedures.  Patient's individual exercise prescription and treatment plan were reviewed.  All starting workloads were established based on the results of the 6 minute walk test done at initial orientation visit.  The plan for exercise progression was also introduced and progression will be customized based on patient's performance and goals.                Exercise Goals and Review:   Exercise Goals     Row Name 11/23/20 1401              Exercise Goals   Increase Physical Activity Yes       Intervention Provide advice, education, support and counseling about physical activity/exercise needs.;Develop an individualized exercise prescription for aerobic and resistive training based on initial evaluation findings, risk stratification, comorbidities and participant's personal goals.       Expected Outcomes Short Term: Attend rehab on a regular basis to increase amount of physical activity.;Long Term: Add in home exercise to make exercise part of routine and to increase amount of physical activity.;Long Term: Exercising regularly at least 3-5 days a week.       Increase Strength and Stamina Yes       Intervention Provide advice, education, support and counseling about physical activity/exercise needs.;Develop an individualized exercise prescription for aerobic and resistive training based on initial evaluation findings, risk stratification, comorbidities and participant's personal goals.       Expected Outcomes Short Term: Perform resistance training exercises routinely during rehab and add in resistance training at home;Short Term: Increase workloads from initial exercise prescription for resistance, speed, and METs.;Long Term: Improve cardiorespiratory fitness, muscular endurance and strength as measured by increased METs and functional capacity (6MWT)       Able to understand and use rate of perceived exertion (RPE) scale Yes       Intervention Provide education and explanation on how to use RPE scale       Expected Outcomes Short Term: Able to use RPE daily in rehab to express subjective intensity level;Long Term:  Able to use RPE to guide intensity level when exercising independently       Able to understand and use Dyspnea scale Yes       Intervention Provide education and explanation on how to use Dyspnea scale       Expected Outcomes Long Term: Able to use Dyspnea scale to guide intensity level when exercising  independently;Short Term: Able to use Dyspnea scale daily in rehab to express subjective sense of shortness of breath during exertion       Knowledge and understanding of Target Heart Rate Range (THRR) Yes       Intervention Provide education and explanation of THRR including  how the numbers were predicted and where they are located for reference       Expected Outcomes Short Term: Able to state/look up THRR;Short Term: Able to use daily as guideline for intensity in rehab;Long Term: Able to use THRR to govern intensity when exercising independently       Able to check pulse independently Yes       Intervention Provide education and demonstration on how to check pulse in carotid and radial arteries.;Review the importance of being able to check your own pulse for safety during independent exercise       Expected Outcomes Short Term: Able to explain why pulse checking is important during independent exercise;Long Term: Able to check pulse independently and accurately       Understanding of Exercise Prescription Yes       Intervention Provide education, explanation, and written materials on patient's individual exercise prescription       Expected Outcomes Long Term: Able to explain home exercise prescription to exercise independently;Short Term: Able to explain program exercise prescription                Exercise Goals Re-Evaluation :  Exercise Goals Re-Evaluation     Row Name 11/24/20 0836 11/28/20 1441 11/29/20 1202 12/13/20 0740 12/14/20 0819     Exercise Goal Re-Evaluation   Exercise Goals Review Able to understand and use rate of perceived exertion (RPE) scale;Knowledge and understanding of Target Heart Rate Range (THRR);Able to understand and use Dyspnea scale;Understanding of Exercise Prescription Increase Physical Activity;Increase Strength and Stamina Increase Physical Activity;Increase Strength and Stamina Increase Physical Activity;Increase Strength and Stamina Increase Physical  Activity;Increase Strength and Stamina;Understanding of Exercise Prescription   Comments Reviewed RPE and dyspnea scales, THR and program prescription with pt today.  Pt voiced understanding and was given a copy of goals to take home. Lance Terry has tolerated exercise well in first 2 sessions.  Staff will monitor progress. Reviewed home exercise with pt today.  Pt plans towalk and use gym at [facility] for exercise.  Reviewed THR, pulse, RPE, sign and symptoms, pulse oximetery and when to call 911 or MD.  Also discussed weather considerations and indoor options.  Pt voiced understanding. Lance Terry has been walking a couple times a week outside program sessions.  He does check HR when exercising on his own. Lance Terry is doing well in rehab.  He will be out next week for vacation but promises to walk the beach while away. He is now running on intervals on the treadmill.  We will continue to monitor his progress.   Expected Outcomes Short: Use RPE daily to regulate intensity. Long: Follow program prescription in THR. Short:  attend consistently Long:  improve overall stamina Short: monitor HR while exercising Long:  increase overall stamina Short: continue to exercise outsie program sessions Long: maintain exercise on his own Short: Continue to run intervals Long: Conitnue to improve stamina    Row Name 12/27/20 1328             Exercise Goal Re-Evaluation   Exercise Goals Review Increase Physical Activity;Increase Strength and Stamina       Comments Lance Terry did walk and play golf while on vacation.  He did not feel good one day at the pool so we reviewed the importance of staying hydrated especially as the weather gets warmer.       Expected Outcomes Short: continue intervals Long:  improve stamina and MET level  Discharge Exercise Prescription (Final Exercise Prescription Changes):  Exercise Prescription Changes - 12/27/20 1300       Response to Exercise   Blood Pressure (Admit) 112/68    Blood  Pressure (Exercise) 138/68    Blood Pressure (Exit) 102/66    Heart Rate (Admit) 85 bpm    Heart Rate (Exercise) 129 bpm    Heart Rate (Exit) 71 bpm    Rating of Perceived Exertion (Exercise) 13    Symptoms none    Duration Continue with 30 min of aerobic exercise without signs/symptoms of physical distress.    Intensity THRR unchanged      Progression   Progression Continue to progress workloads to maintain intensity without signs/symptoms of physical distress.    Average METs 5.3      Resistance Training   Training Prescription Yes    Weight 7 lb    Reps 10-15      Interval Training   Interval Training Yes    Equipment Treadmill    Comments 1 min running intervals      Treadmill   MPH 3.5    Grade 4.5    Minutes 15    METs 5.85      REL-XR   Level 8    Minutes 15      Home Exercise Plan   Plans to continue exercise at Home (comment)   walking and gym at [place] 2 additional days to program exercise sessions.    Initial Home Exercises Provided 11/29/20             Nutrition:  Target Goals: Understanding of nutrition guidelines, daily intake of sodium <1568m, cholesterol <2060m calories 30% from fat and 7% or less from saturated fats, daily to have 5 or more servings of fruits and vegetables.  Education: All About Nutrition: -Group instruction provided by verbal, written material, interactive activities, discussions, models, and posters to present general guidelines for heart healthy nutrition including fat, fiber, MyPlate, the role of sodium in heart healthy nutrition, utilization of the nutrition label, and utilization of this knowledge for meal planning. Follow up email sent as well. Written material given at graduation. [facility]  Date 11/29/20  [facility] Review Code 1- Verbalizes Understanding       Biometrics:  Pre Biometrics - 11/23/20 1156       Pre  Biometrics   Height 5' 10.5" (1.791 m)    Weight 212 lb (96.2 kg)    BMI (Calculated) 29.98    Single Leg Stand 30 seconds              Nutrition Therapy Plan and Nutrition Goals:  Nutrition Therapy & Goals - 12/04/20 0816       Nutrition Therapy   Diet Heart healthy, low Na    Protein (specify units) 75g    Fiber 30 grams    Whole Grain Foods 3 servings    Saturated Fats 12 max. grams    Fruits and Vegetables 8 servings/day    Sodium 1.5 grams      Personal Nutrition Goals   Nutrition Goal ST: limit red meat to 1x/week, eat a CHO at every meal LT: limit red meat a couple times per month, increase food variety (starchy vegetables, beans, all the colors of fruits/vegetables in a week)    Comments V8 (low Na) L: sandwich (whole wheat) or go out to eat (slaad with grilled  chicken) D: steak or pork or grilled chicken or ground [place] or salmon and salad, sometimes pasta, squash and zucchini and onions as well as brussells sprouts. They have quinoa and sometimes cauliflower rice. Usually canola oil. Reviewed heart healthy eating.      Intervention Plan   Intervention Prescribe, educate and counsel regarding individualized specific dietary modifications aiming towards targeted core components such as weight, hypertension, lipid management, diabetes, heart failure and other comorbidities.;Nutrition handout(s) given to patient.    Expected Outcomes Short Term Goal: Understand basic principles of dietary content, such as calories, fat, sodium, cholesterol and nutrients.;Short Term Goal: A plan has been developed with personal nutrition goals set during dietitian appointment.;Long Term Goal: Adherence to prescribed nutrition plan.             Nutrition Assessments:  MEDIFICTS Score Key: ?70 Need to make dietary changes  40-70 Heart Healthy Diet ? 40 Therapeutic Level Cholesterol Diet  Flowsheet Row Cardiac Rehab from 11/23/2020 in [facility] Cardiac and Pulmonary Rehab  Picture Your  Plate Total Score on Admission 63      Picture Your Plate Scores: <91 Unhealthy dietary pattern with much room for improvement. 41-50 Dietary pattern unlikely to meet recommendations for good health and room for improvement. 51-60 More healthful dietary pattern, with some room for improvement.  >60 Healthy dietary pattern, although there may be some specific behaviors that could be improved.    Nutrition Goals Re-Evaluation:   Nutrition Goals Discharge (Final Nutrition Goals Re-Evaluation):   Psychosocial: Target Goals: Acknowledge presence or absence of significant depression and/or stress, maximize coping skills, provide positive support system. Participant is able to verbalize types and ability to use techniques and skills needed for reducing stress and depression.   Education: Stress, Anxiety, and Depression - Group verbal and visual presentation to define topics covered.  Reviews how body is impacted by stress, anxiety, and depression.  Also discusses healthy ways to reduce stress and to treat/manage anxiety and depression.  Written material given at graduation. Flowsheet Row Cardiac Rehab from 12/28/2020 in [facility] Cardiac and Pulmonary Rehab  Date 12/28/20  Educator [facility]  Instruction Review Code 1- [employer] Understanding       Education: Sleep Hygiene -Provides group verbal and written instruction about how sleep can affect your health.  Define sleep hygiene, discuss sleep cycles and impact of sleep habits. Review good sleep hygiene tips.    Initial Review & Psychosocial Screening:  Initial Psych Review & Screening - 11/22/20 1536       Initial Review   Current issues with Current Stress Concerns    Source of Stress Concerns Unable to participate in former interests or hobbies      [place]? Yes   wife     Barriers   Psychosocial barriers to participate in program There are no identifiable barriers or psychosocial needs.      Screening  Interventions   Expected Outcomes Short Term goal: Utilizing psychosocial counselor, staff and physician to assist with identification of specific Stressors or current issues interfering with healing process. Setting desired goal for each stressor or current issue identified.;Long Term Goal: Stressors or current issues are controlled or eliminated.;Short Term goal: Identification and review with participant of any Quality of Life or Depression concerns found by scoring the questionnaire.;Long Term goal: The participant improves quality of Life and PHQ9 Scores as seen by post scores and/or verbalization of changes  Quality of Life Scores:   Quality of Life - 11/23/20 1204       Quality of Life   Select Quality of Life      Quality of Life Scores   Health/Function Pre 25.6 %    Socioeconomic Pre 21.75 %    Psych/Spiritual Pre 24 %    Family Pre 30 %    GLOBAL Pre 25.03 %            Scores of 19 and below usually indicate a poorer quality of life in these areas.  A difference of  2-3 points is a clinically meaningful difference.  A difference of 2-3 points in the total score of the Quality of Life Index has been associated with significant improvement in overall quality of life, self-image, physical symptoms, and general health in studies assessing change in quality of life.  PHQ-9: Recent Review Flowsheet Data     Depression screen [facility] 2/9 11/23/2020   Decreased Interest 1   Down, Depressed, Hopeless 1   PHQ - 2 Score 2   Altered sleeping 0   Tired, decreased energy 1   Change in appetite 1   Feeling bad or failure about yourself  1   Trouble concentrating 1   Moving slowly or fidgety/restless 0   Suicidal thoughts 0   PHQ-9 Score 6   Difficult doing work/chores Not difficult at all      Interpretation of Total Score  Total Score Depression Severity:  1-4 = Minimal depression, 5-9 = Mild depression, 10-14 = Moderate depression, 15-19 = Moderately severe  depression, 20-27 = Severe depression   Psychosocial Evaluation and Intervention:  Psychosocial Evaluation - 11/22/20 1543       Psychosocial Evaluation & Interventions   Interventions Encouraged to exercise with the program and follow exercise prescription    Comments Lance Terry reports feeling better after his stent. His symptoms started around Thanksgiving so he is thankful to be feeling better. Prior to his initial symptoms, he was preparing to run a half marathon and since his symptoms started he has become more sedentary. He is looking forward to getting back into an exercise routine with the help and supervision of the cardiac rehab staff. 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 His wife and kids are his main support system. He is excited to start the program as he is ready to get back to his hobbies without having his anginal and shortness of breath symptoms.    Expected Outcomes Short: attend cardiac rehab for education and exercise. Long: develop and maintain positive self care habits.    Continue Psychosocial Services  Follow up required by staff             Psychosocial Re-Evaluation:  Psychosocial Re-Evaluation     [place] Name 12/13/20 0741             Psychosocial Re-Evaluation   Comments Lance Terry reports no stress anxiety or depression concerns.  He states he sleeps well.       Expected Outcomes Short: continue to attend program sessions tp help manage stress Long: maintain positive outlook                Psychosocial Discharge (Final Psychosocial Re-Evaluation):  Psychosocial Re-Evaluation - 12/13/20 0741       Psychosocial Re-Evaluation   Comments Lance Terry reports no stress anxiety or depression concerns.  He states he sleeps well.    Expected  Outcomes Short: continue to attend program sessions tp help manage stress Long: maintain positive outlook             Vocational Rehabilitation: Provide vocational rehab assistance to  qualifying candidates.   Vocational Rehab Evaluation & Intervention:  Vocational Rehab - 11/22/20 1536       Initial Vocational Rehab Evaluation & Intervention   Assessment shows need for Vocational Rehabilitation No             Education: Education Goals: Education classes will be provided on a variety of topics geared toward better understanding of heart health and risk factor modification. Participant will state understanding/return demonstration of topics presented as noted by education test scores.  Learning Barriers/Preferences:  Learning Barriers/Preferences - 11/22/20 1536       Learning Barriers/Preferences   Learning Barriers None    Learning Preferences None             General Cardiac Education Topics:  AED/CPR: - Group verbal and written instruction with the use of models to demonstrate the basic use of the AED with the basic ABC's of resuscitation.   Anatomy and Cardiac Procedures: - Group verbal and visual presentation and models provide information about basic cardiac anatomy and function. Reviews the testing methods done to diagnose heart disease and the outcomes of the test results. Describes the treatment choices: Medical Management, Angioplasty, or Coronary Bypass Surgery for treating various heart conditions including Myocardial Infarction, Angina, Valve Disease, and Cardiac Arrhythmias.  Written material given at graduation.   Medication Safety: - Group verbal and visual instruction to review commonly prescribed medications for heart and lung disease. Reviews the medication, class of the drug, and side effects. Includes the steps to properly store meds and maintain the prescription regimen.  Written material given at graduation. Flowsheet Row Cardiac Rehab from 12/28/2020 in [facility] Cardiac and Pulmonary Rehab  Date 12/07/20  Educator [facility]  Instruction Review Code 1- Verbalizes Understanding       Intimacy: - Group verbal instruction through game  format to discuss how heart and lung disease can affect sexual intimacy. Written material given at graduation..   Know Your Numbers and Heart Failure: - Group verbal and visual instruction to discuss disease risk factors for cardiac and pulmonary disease and treatment options.  Reviews associated critical values for Overweight/Obesity, Hypertension, Cholesterol, and Diabetes.  Discusses basics of heart failure: signs/symptoms and treatments.  Introduces Heart Failure Zone chart for action plan for heart failure.  Written material given at graduation. Flowsheet Row Cardiac Rehab from 12/28/2020 in [facility] Cardiac and Pulmonary Rehab  Date 12/13/20  Educator [facility]  Instruction Review Code 1- Verbalizes Understanding       Infection Prevention: - Provides verbal and written material to individual with discussion of infection control including proper hand washing and proper equipment cleaning during exercise session. Flowsheet Row Cardiac Rehab from 12/28/2020 in [facility] Cardiac and Pulmonary Rehab  Education need identified 11/23/20  Date 11/23/20  Educator [place]  Instruction Review Code 1- Verbalizes Understanding       Falls Prevention: - Provides verbal and written material to individual with discussion of falls prevention and safety. Flowsheet Row Cardiac Rehab from 12/28/2020 in [facility] Cardiac and Pulmonary Rehab  Education need identified 11/23/20  Date 11/23/20  Educator [place]  Instruction Review Code 1- Verbalizes Understanding       Other: -Provides group and verbal instruction on various topics (see comments)   Knowledge Questionnaire Score:  Knowledge Questionnaire Score - 11/23/20 1203  Knowledge Questionnaire Score   Pre Score 23/26: [place], nutrition             Core Components/Risk Factors/Patient Goals at Admission:  Personal Goals and Risk Factors at Admission - 11/23/20 1401       Core Components/Risk Factors/Patient Goals on Admission    Weight Management  Yes;Weight Loss    Intervention Weight Management: Develop a combined nutrition and exercise program designed to reach desired caloric intake, while maintaining appropriate intake of nutrient and fiber, sodium and fats, and appropriate energy expenditure required for the weight goal.;Weight Management: Provide education and appropriate resources to help participant work on and attain dietary goals.;Weight Management/Obesity: Establish reasonable short term and long term weight goals.    Admit Weight 212 lb (96.2 kg)    Goal Weight: Short Term 207 lb (93.9 kg)    Goal Weight: Long Term 190 lb (86.2 kg)    Expected Outcomes Short Term: Continue to assess and modify interventions until short term weight is achieved;Long Term: Adherence to nutrition and physical activity/exercise program aimed toward attainment of established weight goal;Weight Loss: Understanding of general recommendations for a balanced deficit meal plan, which promotes 1-2 lb weight loss per week and includes a negative energy balance of [phone] kcal/d;Understanding recommendations for meals to include 15-35% energy as protein, 25-35% energy from fat, 35-60% energy from carbohydrates, less than 239m of dietary cholesterol, 20-35 gm of total fiber daily;Understanding of distribution of calorie intake throughout the day with the consumption of 4-5 meals/snacks    Hypertension Yes    Intervention Provide education on lifestyle modifcations including regular physical activity/exercise, weight management, moderate sodium restriction and increased consumption of fresh fruit, vegetables, and low fat dairy, alcohol moderation, and smoking cessation.;Monitor prescription use compliance.    Expected Outcomes Short Term: Continued assessment and intervention until BP is < 140/964mHG in hypertensive participants. < 130/8021mG in hypertensive participants with diabetes, heart failure or chronic kidney disease.;Long Term: Maintenance of blood pressure  at goal levels.    Lipids Yes    Intervention Provide education and support for participant on nutrition & aerobic/resistive exercise along with prescribed medications to achieve LDL <53m33mDL >40mg50m Expected Outcomes Long Term: Cholesterol controlled with medications as prescribed, with individualized exercise RX and with personalized nutrition plan. Value goals: LDL < 53mg,40m > 40 mg.;Short Term: Participant states understanding of desired cholesterol values and is compliant with medications prescribed. Participant is following exercise prescription and nutrition guidelines.             Education:Diabetes - Individual verbal and written instruction to review signs/symptoms of diabetes, desired ranges of glucose level fasting, after meals and with exercise. Acknowledge that pre and post exercise glucose checks will be done for 3 sessions at entry of program.   Core Components/Risk Factors/Patient Goals Review:   Goals and Risk Factor Review     Row Name 12/13/20 0736 12/13/20 0738           Core Components/Risk Factors/Patient Goals Review   Personal Goals Review Weight Management/Obesity;Lipids;Hypertension --      Review Lance Terry Tyqwanking meds as directed.  BP resting today was 108/60. He reports weight is staying steady.  He states he eats heart healthy most of the time.      Expected Outcomes -- Short:  continue to take meds as directed and monitor risk factors Long: manage risk factors and keep at optimal levels  Core Components/Risk Factors/Patient Goals at Discharge (Final Review):   Goals and Risk Factor Review - 12/13/20 0738       Core Components/Risk Factors/Patient Goals Review   Review He reports weight is staying steady.  He states he eats heart healthy most of the time.    Expected Outcomes Short:  continue to take meds as directed and monitor risk factors Long: manage risk factors and keep at optimal levels             ITP Comments:   ITP Comments     Row Name 11/22/20 1540 11/23/20 0948 11/24/20 0834 12/06/20 0714 01/03/21 0811   ITP Comments Initial telephone orientation completed. Diagnosis can be found in [facility] 4/25. EP orientation scheduled for Thursday 5/5 at 8am. Completed 6MWT and gym orientation. Initial ITP created and sent for review to Dr. Emily Filbert, Medical Director. First full day of exercise!  Patient was oriented to gym and equipment including functions, settings, policies, and procedures.  Patient's individual exercise prescription and treatment plan were reviewed.  All starting workloads were established based on the results of the 6 minute walk test done at initial orientation visit.  The plan for exercise progression was also introduced and progression will be customized based on patient's performance and goals. 30 Day review completed. Medical Director ITP review done, changes made as directed, and signed approval by Medical Director.  New to program 30 Day review completed. Medical Director ITP review done, changes made as directed, and signed approval by Medical Director.            Comments:

## 2021-01-04 ENCOUNTER — Other Ambulatory Visit: Payer: Self-pay

## 2021-01-04 ENCOUNTER — Encounter: Payer: BC Managed Care – PPO | Admitting: *Deleted

## 2021-01-04 DIAGNOSIS — Z955 Presence of coronary angioplasty implant and graft: Secondary | ICD-10-CM

## 2021-01-04 NOTE — Progress Notes (Signed)
Daily Session Note  Patient Details  Name: Lance Terry MRN: [id number] Date of Birth: 01-Jan-1976 Referring Provider:   Flowsheet Row Cardiac Rehab from 11/23/2020 in [facility] Cardiac and Pulmonary Rehab  Referring Provider Glori Bickers MD       Encounter Date: 01/04/2021  Check In:  Session Check In - 01/04/21 0815       Check-In   Supervising physician immediately available to respond to emergencies See telemetry face sheet for immediately available ER MD    Location [facility]-Cardiac & Pulmonary Rehab    Staff Present Heath Lark, RN, BSN, Laveda Norman, BS, ACSM CEP, Exercise Physiologist;Amanda Oletta Darter, [place], ACSM CEP, Exercise Physiologist    Virtual Visit No    Medication changes reported     No    Fall or balance concerns reported    No    Warm-up and Cool-down Performed on first and last piece of equipment    Resistance Training Performed Yes    VAD Patient? No    PAD/SET Patient? No      Pain Assessment   Currently in Pain? No/denies                Social History   Tobacco Use  Smoking Status Former   Pack years: 0.00  Smokeless Tobacco Never    Goals Met:  Independence with exercise equipment Exercise tolerated well No report of cardiac concerns or symptoms  Goals Unmet:  Not Applicable  Comments: Pt able to follow exercise prescription today without complaint.  Will continue to monitor for progression.    Dr. Emily Filbert is Medical Director for [place].  Dr. Ottie Glazier is Medical Director for [facility] Pulmonary Rehabilitation.

## 2021-01-10 ENCOUNTER — Other Ambulatory Visit: Payer: Self-pay

## 2021-01-10 DIAGNOSIS — Z955 Presence of coronary angioplasty implant and graft: Secondary | ICD-10-CM

## 2021-01-10 NOTE — Progress Notes (Signed)
Daily Session Note  Patient Details  Name: Lance Terry MRN: [id number] Date of Birth: 1975-09-11 Referring Provider:   Flowsheet Row Cardiac Rehab from 11/23/2020 in [facility] Cardiac and Pulmonary Rehab  Referring Provider Glori Bickers MD       Encounter Date: 01/10/2021  Check In:  Session Check In - 01/10/21 0928       Check-In   Supervising physician immediately available to respond to emergencies See telemetry face sheet for immediately available ER MD    Location [facility]-Cardiac & Pulmonary Rehab    Staff Present Birdie Sons, MPA, Elveria Rising, BA, ACSM CEP, Exercise Physiologist;Joseph Tessie Fass RCP,RRT,BSRT    Virtual Visit No    Medication changes reported     No    Fall or balance concerns reported    No    Warm-up and Cool-down Performed on first and last piece of equipment    Resistance Training Performed Yes    VAD Patient? No    PAD/SET Patient? No      Pain Assessment   Currently in Pain? No/denies                Social History   Tobacco Use  Smoking Status Former   Pack years: 0.00  Smokeless Tobacco Never    Goals Met:  Independence with exercise equipment Exercise tolerated well No report of cardiac concerns or symptoms Strength training completed today  Goals Unmet:  Not Applicable  Comments: Pt able to follow exercise prescription today without complaint.  Will continue to monitor for progression.    Dr. Emily Filbert is Medical Director for [place].  Dr. Ottie Glazier is Medical Director for [facility] Pulmonary Rehabilitation.

## 2021-01-12 ENCOUNTER — Encounter: Payer: BC Managed Care – PPO | Admitting: *Deleted

## 2021-01-12 ENCOUNTER — Other Ambulatory Visit: Payer: Self-pay

## 2021-01-12 DIAGNOSIS — Z955 Presence of coronary angioplasty implant and graft: Secondary | ICD-10-CM | POA: Diagnosis not present

## 2021-01-12 NOTE — Progress Notes (Signed)
Daily Session Note  Patient Details  Name: Lance Terry MRN: [id number] Date of Birth: 06/04/1976 Referring Provider:   Flowsheet Row Cardiac Rehab from 11/23/2020 in [facility] Cardiac and Pulmonary Rehab  Referring Provider Glori Bickers MD       Encounter Date: 01/12/2021  Check In:  Session Check In - 01/12/21 0933       Check-In   Supervising physician immediately available to respond to emergencies See telemetry face sheet for immediately available ER MD    Location [facility]-Cardiac & Pulmonary Rehab    Staff Present Renita Papa, RN BSN;Joseph [address] [place], [place], RCEP, CCRP, CCET    Virtual Visit No    Medication changes reported     No    Fall or balance concerns reported    No    Warm-up and Cool-down Performed on first and last piece of equipment    Resistance Training Performed Yes    VAD Patient? No    PAD/SET Patient? No      Pain Assessment   Currently in Pain? No/denies                Social History   Tobacco Use  Smoking Status Former   Pack years: 0.00  Smokeless Tobacco Never    Goals Met:  Independence with exercise equipment Exercise tolerated well No report of cardiac concerns or symptoms Strength training completed today  Goals Unmet:  Not Applicable  Comments: Pt able to follow exercise prescription today without complaint.  Will continue to monitor for progression.    Dr. Emily Filbert is Medical Director for [place].  Dr. Ottie Glazier is Medical Director for [facility] Pulmonary Rehabilitation.

## 2021-01-15 ENCOUNTER — Encounter: Payer: BC Managed Care – PPO | Admitting: *Deleted

## 2021-01-15 ENCOUNTER — Other Ambulatory Visit: Payer: Self-pay

## 2021-01-15 DIAGNOSIS — Z955 Presence of coronary angioplasty implant and graft: Secondary | ICD-10-CM | POA: Diagnosis not present

## 2021-01-15 NOTE — Progress Notes (Signed)
Daily Session Note  Patient Details  Name: Lance Terry MRN: [id number] Date of Birth: Sep 28, 1975 Referring Provider:   Flowsheet Row Cardiac Rehab from 11/23/2020 in [facility] Cardiac and Pulmonary Rehab  Referring Provider Glori Bickers MD       Encounter Date: 01/15/2021  Check In:  Session Check In - 01/15/21 0820       Check-In   Supervising physician immediately available to respond to emergencies See telemetry face sheet for immediately available ER MD    Location [facility]-Cardiac & Pulmonary Rehab                Social History   Tobacco Use  Smoking Status Former   Pack years: 0.00  Smokeless Tobacco Never    Goals Met:  Independence with exercise equipment Exercise tolerated well No report of cardiac concerns or symptoms  Goals Unmet:  Not Applicable  Comments: Pt able to follow exercise prescription today without complaint.  Will continue to monitor for progression.    Dr. Emily Filbert is Medical Director for [place].  Dr. Ottie Glazier is Medical Director for [facility] Pulmonary Rehabilitation.

## 2021-01-16 ENCOUNTER — Other Ambulatory Visit: Payer: Self-pay

## 2021-01-16 DIAGNOSIS — Z955 Presence of coronary angioplasty implant and graft: Secondary | ICD-10-CM | POA: Diagnosis not present

## 2021-01-16 NOTE — Progress Notes (Signed)
Daily Session Note  Patient Details  Name: Lance Terry MRN: [id number] Date of Birth: 1976-02-07 Referring Provider:   Flowsheet Row Cardiac Rehab from 11/23/2020 in [facility] Cardiac and Pulmonary Rehab  Referring Provider Glori Bickers MD       Encounter Date: 01/16/2021  Check In:  Session Check In - 01/16/21 0710       Check-In   Supervising physician immediately available to respond to emergencies See telemetry face sheet for immediately available ER MD    Location [facility]-Cardiac & Pulmonary Rehab    Staff Present Birdie Sons, MPA, Elveria Rising, BA, ACSM CEP, Exercise Physiologist;Kara Eliezer Bottom, MS, ASCM CEP, Exercise Physiologist    Virtual Visit No    Medication changes reported     No    Fall or balance concerns reported    No    Warm-up and Cool-down Performed on first and last piece of equipment    Resistance Training Performed Yes    VAD Patient? No    PAD/SET Patient? No      Pain Assessment   Currently in Pain? No/denies                Social History   Tobacco Use  Smoking Status Former   Pack years: 0.00  Smokeless Tobacco Never    Goals Met:  Independence with exercise equipment Exercise tolerated well No report of cardiac concerns or symptoms Strength training completed today  Goals Unmet:  Not Applicable  Comments: Pt able to follow exercise prescription today without complaint.  Will continue to monitor for progression.   Dr. Emily Filbert is Medical Director for [place].  Dr. Ottie Glazier is Medical Director for [facility] Pulmonary Rehabilitation.

## 2021-01-18 ENCOUNTER — Other Ambulatory Visit: Payer: Self-pay

## 2021-01-18 DIAGNOSIS — Z955 Presence of coronary angioplasty implant and graft: Secondary | ICD-10-CM | POA: Diagnosis not present

## 2021-01-18 NOTE — Progress Notes (Signed)
Daily Session Note  Patient Details  Name: Lance Terry MRN: [id number] Date of Birth: 05/28/1976 Referring Provider:   Flowsheet Row Cardiac Rehab from 11/23/2020 in [facility] Cardiac and Pulmonary Rehab  Referring Provider Glori Bickers MD       Encounter Date: 01/18/2021  Check In:  Session Check In - 01/18/21 1601       Check-In   Supervising physician immediately available to respond to emergencies See telemetry face sheet for immediately available ER MD    Location [facility]-Cardiac & Pulmonary Rehab    Staff Present Birdie Sons, MPA, Mauricia Area, BS, ACSM CEP, Exercise Physiologist;Amanda Oletta Darter, BA, ACSM CEP, Exercise Physiologist    Virtual Visit No    Medication changes reported     No    Fall or balance concerns reported    No    Warm-up and Cool-down Performed on first and last piece of equipment    Resistance Training Performed Yes    VAD Patient? No    PAD/SET Patient? No      Pain Assessment   Currently in Pain? No/denies                Social History   Tobacco Use  Smoking Status Former   Pack years: 0.00  Smokeless Tobacco Never    Goals Met:  Independence with exercise equipment Exercise tolerated well No report of cardiac concerns or symptoms Strength training completed today  Goals Unmet:  Not Applicable  Comments: Pt able to follow exercise prescription today without complaint.  Will continue to monitor for progression.    Dr. Emily Filbert is Medical Director for [place].  Dr. Ottie Glazier is Medical Director for [facility] Pulmonary Rehabilitation.

## 2021-01-19 ENCOUNTER — Ambulatory Visit: Payer: BC Managed Care – PPO

## 2021-01-23 ENCOUNTER — Other Ambulatory Visit: Payer: Self-pay

## 2021-01-23 ENCOUNTER — Encounter: Payer: BC Managed Care – PPO | Attending: Internal Medicine | Admitting: *Deleted

## 2021-01-23 VITALS — Ht 70.5 in | Wt 218.6 lb

## 2021-01-23 DIAGNOSIS — Z48812 Encounter for surgical aftercare following surgery on the circulatory system: Secondary | ICD-10-CM | POA: Diagnosis not present

## 2021-01-23 DIAGNOSIS — Z955 Presence of coronary angioplasty implant and graft: Secondary | ICD-10-CM | POA: Insufficient documentation

## 2021-01-23 NOTE — Progress Notes (Signed)
Daily Session Note  Patient Details  Name: Lance Terry MRN: [id number] Date of Birth: [birth year] Referring Provider:   Flowsheet Row Cardiac Rehab from 11/23/2020 in [facility] Cardiac and Pulmonary Rehab  Referring Provider Glori Bickers MD       Encounter Date: 01/23/2021  Check In:  Session Check In - 01/23/21 0810       Check-In   Staff Present Hope Budds, RDN, LDN;Jessica Luan Pulling, MA, RCEP, CCRP, CCET;Kristen Coble, RN,BC,MSN;Kerim Statzer, RN, BSN, CCRP    Virtual Visit No    Medication changes reported     No    Fall or balance concerns reported    No    Warm-up and Cool-down Performed on first and last piece of equipment    Resistance Training Performed Yes    VAD Patient? No    PAD/SET Patient? No      Pain Assessment   Currently in Pain? No/denies                Social History   Tobacco Use  Smoking Status Former   Pack years: 0.00  Smokeless Tobacco Never    Goals Met:  Independence with exercise equipment Exercise tolerated well No report of cardiac concerns or symptoms  Goals Unmet:  Not Applicable  Comments: Pt able to follow exercise prescription today without complaint.  Will continue to monitor for progression.    Dr. Emily Filbert is Medical Director for [place].  Dr. Ottie Glazier is Medical Director for [facility] Pulmonary Rehabilitation.

## 2021-01-23 NOTE — Patient Instructions (Signed)
Discharge Patient Instructions  Patient Details  Name: Lance Terry MRN: [id number] Date of Birth: 1975-10-28 Referring Provider:  Jolaine Artist, MD   Number of Visits: 30  Reason for Discharge:  Patient reached a stable level of exercise. Patient independent in their exercise. Patient has met program and personal goals.  Smoking History:  Social History   Tobacco Use  Smoking Status Former   Pack years: 0.00  Smokeless Tobacco Never    Diagnosis:  Status post coronary artery stent placement  Initial Exercise Prescription:  Initial Exercise Prescription - 11/23/20 1300       Date of Initial Exercise RX and Referring Provider   Date 11/23/20    Referring Provider Glori Bickers MD      Treadmill   MPH 3.1    Grade 2    Minutes 15    METs 4.23      Recumbant Bike   Level 4    RPM 50    Watts 45    Minutes 15    METs 4.9      NuStep   Level 4    SPM 80    Minutes 15    METs 4.9      REL-XR   Level 4    Speed 50    Minutes 15    METs 4.9      Track   Laps 45    Minutes 15    METs 3.4      Prescription Details   Frequency (times per week) 3    Duration Progress to 30 minutes of continuous aerobic without signs/symptoms of physical distress      Intensity   THRR 40-80% of Max Heartrate 112-154    Ratings of Perceived Exertion 11-13    Perceived Dyspnea 0-4      Progression   Progression Continue to progress workloads to maintain intensity without signs/symptoms of physical distress.      Resistance Training   Training Prescription Yes    Weight 6 lb    Reps 10-15             Discharge Exercise Prescription (Final Exercise Prescription Changes):  Exercise Prescription Changes - 01/10/21 0700       Response to Exercise   Blood Pressure (Admit) 124/74    Blood Pressure (Exercise) 162/82    Blood Pressure (Exit) 106/72    Heart Rate (Admit) 85 bpm    Heart Rate (Exercise) 129 bpm    Heart Rate (Exit) 94 bpm    Rating of  Perceived Exertion (Exercise) 14    Symptoms none    Duration Continue with 30 min of aerobic exercise without signs/symptoms of physical distress.    Intensity THRR unchanged      Progression   Progression Continue to progress workloads to maintain intensity without signs/symptoms of physical distress.    Average METs 5.67      Resistance Training   Training Prescription Yes    Weight 7 lb    Reps 10-15      Interval Training   Interval Training Yes    Equipment Treadmill    Comments 1 min running intervals      Treadmill   MPH 4.5    Grade 2    Minutes 15    METs 8.5      Recumbant Elliptical   Level 5    Minutes 15    METs 4.3      Elliptical   Level 1  Speed 4    Minutes 15      REL-XR   Level 8    Minutes 15    METs 3.9      Home Exercise Plan   Plans to continue exercise at Home (comment)   walking and gym at [place] 2 additional days to program exercise sessions.    Initial Home Exercises Provided 11/29/20             Functional Capacity:  6 Minute Walk     Row Name 11/23/20 1145 01/23/21 0754       6 Minute Walk   Phase Initial Discharge    Distance 1760 feet 2100 feet    Walk Time 6 minutes 6 minutes    # of Rest Breaks 0 0    MPH 3.33 3.98    METS 4.97 5.59    RPE 11 15    Perceived Dyspnea  1 --    VO2 Peak 17.4 19.58    Symptoms Yes (comment) No    Comments Slight SOB --    Resting HR 71 bpm 75 bpm    Resting BP 114/74 124/70    Resting Oxygen Saturation  99 % 96 %    Exercise Oxygen Saturation  during 6 min walk 100 % 98 %    Max Ex. HR 107 bpm 108 bpm    Max Ex. BP 124/78 134/76    2 Minute Post BP 112/78 --             Nutrition & Weight - Outcomes:  Pre Biometrics - 11/23/20 1156       Pre Biometrics   Height 5' 10.5" (1.791 m)    Weight 212 lb (96.2 kg)    BMI (Calculated) 29.98    Single Leg Stand 30 seconds             Post Biometrics - 01/23/21 0754        Post  Biometrics   Height  5' 10.5" (1.791 m)    Weight 218 lb 9.6 oz (99.2 kg)    BMI (Calculated) 30.91    Single Leg Stand 30 seconds             Nutrition:  Nutrition Therapy & Goals - 12/04/20 0816       Nutrition Therapy   Diet Heart healthy, low Na    Protein (specify units) 75g    Fiber 30 grams    Whole Grain Foods 3 servings    Saturated Fats 12 max. grams    Fruits and Vegetables 8 servings/day    Sodium 1.5 grams      Personal Nutrition Goals   Nutrition Goal ST: limit red meat to 1x/week, eat a CHO at every meal LT: limit red meat a couple times per month, increase food variety (starchy vegetables, beans, all the colors of fruits/vegetables in a week)    Comments V8 (low Na) L: sandwich (whole wheat) or go out to eat (slaad with grilled chicken) D: steak or pork or grilled chicken or ground [place] or salmon and salad, sometimes pasta, squash and zucchini and onions as well as brussells sprouts. They have quinoa and sometimes cauliflower rice. Usually canola oil. Reviewed heart healthy eating.      Intervention Plan   Intervention Prescribe, educate and counsel regarding individualized specific dietary modifications aiming towards targeted core components such as weight, hypertension, lipid management, diabetes, heart failure and other comorbidities.;Nutrition handout(s) given to patient.  Expected Outcomes Short Term Goal: Understand basic principles of dietary content, such as calories, fat, sodium, cholesterol and nutrients.;Short Term Goal: A plan has been developed with personal nutrition goals set during dietitian appointment.;Long Term Goal: Adherence to prescribed nutrition plan.              Goals reviewed with patient; copy given to patient.

## 2021-01-25 ENCOUNTER — Encounter: Payer: BC Managed Care – PPO | Admitting: *Deleted

## 2021-01-25 ENCOUNTER — Other Ambulatory Visit: Payer: Self-pay

## 2021-01-25 DIAGNOSIS — Z955 Presence of coronary angioplasty implant and graft: Secondary | ICD-10-CM | POA: Diagnosis not present

## 2021-01-25 DIAGNOSIS — Z48812 Encounter for surgical aftercare following surgery on the circulatory system: Secondary | ICD-10-CM | POA: Diagnosis not present

## 2021-01-25 NOTE — Progress Notes (Signed)
Discharge Progress Report  Patient Details  Name: Lance Terry MRN: [id number] Date of Birth: 19-Jul-1976 Referring Provider:   Flowsheet Row Cardiac Rehab from 11/23/2020 in [facility]  Referring Provider Glori Bickers MD        Number of Visits: 30  Reason for Discharge:  Patient reached a stable level of exercise. Patient independent in their exercise.  Smoking History:  Social History   Tobacco Use  Smoking Status Former   Pack years: 0.00  Smokeless Tobacco Never    Diagnosis:  Status post coronary artery stent placement  ADL [facility]:   Initial Exercise Prescription:  Initial Exercise Prescription - 11/23/20 1300       Date of Initial Exercise RX and Referring Provider   Date 11/23/20    Referring Provider Glori Bickers MD      Treadmill   MPH 3.1    Grade 2    Minutes 15    METs 4.23      Recumbant Bike   Level 4    RPM 50    Watts 45    Minutes 15    METs 4.9      NuStep   Level 4    SPM 80    Minutes 15    METs 4.9      REL-XR   Level 4    Speed 50    Minutes 15    METs 4.9      Track   Laps 45    Minutes 15    METs 3.4      Prescription Details   Frequency (times per week) 3    Duration Progress to 30 minutes of continuous aerobic without signs/symptoms of physical distress      Intensity   THRR 40-80% of Max Heartrate 112-154    Ratings of Perceived Exertion 11-13    Perceived Dyspnea 0-4      Progression   Progression Continue to progress workloads to maintain intensity without signs/symptoms of physical distress.      Resistance Training   Training Prescription Yes    Weight 6 lb    Reps 10-15             Discharge Exercise Prescription (Final Exercise Prescription Changes):  Exercise Prescription Changes - 01/10/21 0700       Response to Exercise   Blood Pressure (Admit) 124/74    Blood Pressure (Exercise) 162/82    Blood Pressure (Exit) 106/72    Heart Rate (Admit) 85 bpm     Heart Rate (Exercise) 129 bpm    Heart Rate (Exit) 94 bpm    Rating of Perceived Exertion (Exercise) 14    Symptoms none    Duration Continue with 30 min of aerobic exercise without signs/symptoms of physical distress.    Intensity THRR unchanged      Progression   Progression Continue to progress workloads to maintain intensity without signs/symptoms of physical distress.    Average METs 5.67      Resistance Training   Training Prescription Yes    Weight 7 lb    Reps 10-15      Interval Training   Interval Training Yes    Equipment Treadmill    Comments 1 min running intervals      Treadmill   MPH 4.5    Grade 2    Minutes 15    METs 8.5      Recumbant Elliptical   Level 5  Minutes 15    METs 4.3      Elliptical   Level 1    Speed 4    Minutes 15      REL-XR   Level 8    Minutes 15    METs 3.9      Home Exercise Plan   Plans to continue exercise at Home (comment)   walking and gym at [place] 2 additional days to program exercise sessions.    Initial Home Exercises Provided 11/29/20             Functional Capacity:  6 Minute Walk     Row Name 11/23/20 1145 01/23/21 0754       6 Minute Walk   Phase Initial Discharge    Distance 1760 feet 2100 feet    Walk Time 6 minutes 6 minutes    # of Rest Breaks 0 0    MPH 3.33 3.98    METS 4.97 5.59    RPE 11 15    Perceived Dyspnea  1 --    VO2 Peak 17.4 19.58    Symptoms Yes (comment) No    Comments Slight SOB --    Resting HR 71 bpm 75 bpm    Resting BP 114/74 124/70    Resting Oxygen Saturation  99 % 96 %    Exercise Oxygen Saturation  during 6 min walk 100 % 98 %    Max Ex. HR 107 bpm 108 bpm    Max Ex. BP 124/78 134/76    2 Minute Post BP 112/78 --             Psychological, QOL, Others - Outcomes: PHQ 2/9: Depression screen [facility] 2/9 01/25/2021 11/23/2020  Decreased Interest 0 1  Down, Depressed, Hopeless 0 1  PHQ - 2 Score 0 2  Altered sleeping 0 0  Tired, decreased energy  0 1  Change in appetite 0 1  Feeling bad or failure about yourself  0 1  Trouble concentrating 0 1  Moving slowly or fidgety/restless 0 0  Suicidal thoughts 0 0  PHQ-9 Score 0 6  Difficult doing work/chores Not difficult at all Not difficult at all    Nutrition & Weight - Outcomes:  Pre Biometrics - 11/23/20 1156       Pre Biometrics   Height 5' 10.5" (1.791 m)    Weight 212 lb (96.2 kg)    BMI (Calculated) 29.98    Single Leg Stand 30 seconds             Post Biometrics - 01/23/21 0754        Post  Biometrics   Height 5' 10.5" (1.791 m)    Weight 218 lb 9.6 oz (99.2 kg)    BMI (Calculated) 30.91    Single Leg Stand 30 seconds             Nutrition:  Nutrition Therapy & Goals - 12/04/20 0816       Nutrition Therapy   Diet Heart healthy, low Na    Protein (specify units) 75g    Fiber 30 grams    Whole Grain Foods 3 servings    Saturated Fats 12 max. grams    Fruits and Vegetables 8 servings/day    Sodium 1.5 grams      Personal Nutrition Goals   Nutrition Goal ST: limit red meat to 1x/week, eat a CHO at every meal LT: limit red meat a couple times per month,  increase food variety (starchy vegetables, beans, all the colors of fruits/vegetables in a week)    Comments V8 (low Na) L: sandwich (whole wheat) or go out to eat (slaad with grilled chicken) D: steak or pork or grilled chicken or ground [place] or salmon and salad, sometimes pasta, squash and zucchini and onions as well as brussells sprouts. They have quinoa and sometimes cauliflower rice. Usually canola oil. Reviewed heart healthy eating.      Intervention Plan   Intervention Prescribe, educate and counsel regarding individualized specific dietary modifications aiming towards targeted core components such as weight, hypertension, lipid management, diabetes, heart failure and other comorbidities.;Nutrition handout(s) given to patient.    Expected Outcomes Short Term Goal: Understand basic principles of  dietary content, such as calories, fat, sodium, cholesterol and nutrients.;Short Term Goal: A plan has been developed with personal nutrition goals set during dietitian appointment.;Long Term Goal: Adherence to prescribed nutrition plan.            Goals reviewed with patient; copy given to patient.

## 2021-01-25 NOTE — Progress Notes (Signed)
Cardiac Individual Treatment Plan  Patient Details  Name: Lance Terry DOBEK MRN: [id number] Date of Birth: 02-02-1976 Referring Provider:   Flowsheet Row Cardiac Rehab from 11/23/2020 in [facility] Cardiac and Pulmonary Rehab  Referring Provider Glori Bickers MD       Initial Encounter Date:  Flowsheet Row Cardiac Rehab from 11/23/2020 in [facility] Cardiac and Pulmonary Rehab  Date 11/23/20       Visit Diagnosis: Status post coronary artery stent placement  Patient's Home Medications on Admission:  Current Outpatient Medications:    aspirin 81 MG tablet, Take 81 mg by mouth daily., Disp: , Rfl:    Cholecalciferol (VITAMIN D) 50 MCG (2000 UT) tablet, Take 4,000-6,000 Units by mouth daily., Disp: , Rfl:    clopidogrel (PLAVIX) 75 MG tablet, Take 1 tablet (75 mg total) by mouth daily., Disp: 30 tablet, Rfl: 6   ezetimibe (ZETIA) 10 MG tablet, Take 1 tablet (10 mg total) by mouth daily., Disp: 90 tablet, Rfl: 3   icosapent Ethyl (VASCEPA) 1 g capsule, Take 2 capsules (2 g total) by mouth 2 (two) times daily., Disp: 120 capsule, Rfl: 6   lisinopril (ZESTRIL) 20 MG tablet, TAKE 1 TABLET (20 MG TOTAL) BY MOUTH DAILY., Disp: 90 tablet, Rfl: 3   metoprolol succinate (TOPROL-XL) 25 MG 24 hr tablet, Take 1 tablet (25 mg total) by mouth daily., Disp: 90 tablet, Rfl: 3   Multiple Vitamins-Minerals (MULTIVITAL) tablet, Take 1 tablet by mouth daily., Disp: , Rfl:    nitroGLYCERIN (NITROSTAT) 0.4 MG SL tablet, Place 1 tablet (0.4 mg total) under the tongue every 5 (five) minutes as needed for chest pain., Disp: 25 tablet, Rfl: 3   rosuvastatin (CRESTOR) 40 MG tablet, Take 1 tablet (40 mg total) by mouth daily., Disp: 90 tablet, Rfl: 3  Past Medical History: Past Medical History:  Diagnosis Date   Coronary artery disease    -small NSTEMI in setting of heat stroke 7/10 -Cath 50-60% LAD; 7/10 -ET.[name]: EF 71% no ischemia 7/10    Hyperlipidemia    mixed   Hypertension     Tobacco Use: Social History    Tobacco Use  Smoking Status Former   Pack years: 0.00  Smokeless Tobacco Never    Labs: Recent Review Scientist, physiological     Labs for ITP Cardiac and Pulmonary Rehab Latest Ref Rng & Units 02/16/2019 05/02/2020 11/14/2020   Cholestrol 0 - 200 mg/dL 150 143 96   LDLCALC 0 - 99 mg/dL 44 77 23   HDL >40 mg/dL 36(L) 42 30(L)   Trlycerides <150 mg/dL 349(H) 139 217(H)   Hemoglobin A1c 4.8 - 5.6 % - - 5.5        Exercise Target Goals: Exercise Program Goal: Individual exercise prescription set using results from initial 6 min walk test and THRR while considering  patient's activity barriers and safety.   Exercise Prescription Goal: Initial exercise prescription builds to 30-45 minutes a day of aerobic activity, 2-3 days per week.  Home exercise guidelines will be given to patient during program as part of exercise prescription that the participant will acknowledge.   Education: Aerobic Exercise: - Group verbal and visual presentation on the components of exercise prescription. Introduces F.I.T.T principle from ACSM for exercise prescriptions.  Reviews F.I.T.T. principles of aerobic exercise including progression. Written material given at graduation.   Education: Resistance Exercise: - Group verbal and visual presentation on the components of exercise prescription. Introduces F.I.T.T principle from ACSM for exercise prescriptions  Reviews F.I.T.T. principles of resistance  exercise including progression. Written material given at graduation. Flowsheet Row Cardiac Rehab from 01/18/2021 in [facility] Cardiac and Pulmonary Rehab  Date 01/18/21  Educator [facility]  Instruction Review Code 1- Verbalizes Understanding        Education: Exercise & Equipment Safety: - Individual verbal instruction and demonstration of equipment use and safety with use of the equipment. Flowsheet Row Cardiac Rehab from 01/18/2021 in [facility] Cardiac and Pulmonary Rehab  Education need identified 11/23/20  Date 11/23/20   Educator [place]  Instruction Review Code 1- Verbalizes Understanding       Education: Exercise Physiology & General Exercise Guidelines: - Group verbal and written instruction with models to review the exercise physiology of the cardiovascular system and associated critical values. Provides general exercise guidelines with specific guidelines to those with heart or lung disease.  Flowsheet Row Cardiac Rehab from 01/18/2021 in [facility] Cardiac and Pulmonary Rehab  Date 01/04/21  Educator [facility]  Instruction Review Code 1- Verbalizes Understanding       Education: Flexibility, Balance, Mind/Body Relaxation: - Group verbal and visual presentation with interactive activity on the components of exercise prescription. Introduces F.I.T.T principle from ACSM for exercise prescriptions. Reviews F.I.T.T. principles of flexibility and balance exercise training including progression. Also discusses the mind body connection.  Reviews various relaxation techniques to help reduce and manage stress (i.e. Deep breathing, progressive muscle relaxation, and visualization). Balance handout provided to take home. Written material given at graduation.   Activity Barriers & Risk Stratification:  Activity Barriers & Cardiac Risk Stratification - 11/23/20 1156       Activity Barriers & Cardiac Risk Stratification   Activity Barriers Other (comment)    Comments left elbow hurts    Cardiac Risk Stratification Moderate             6 Minute Walk:  6 Minute Walk     Row Name 11/23/20 1145 01/23/21 0754       6 Minute Walk   Phase Initial Discharge    Distance 1760 feet 2100 feet    Walk Time 6 minutes 6 minutes    # of Rest Breaks 0 0    MPH 3.33 3.98    METS 4.97 5.59    RPE 11 15    Perceived Dyspnea  1 --    VO2 Peak 17.4 19.58    Symptoms Yes (comment) No    Comments Slight SOB --    Resting HR 71 bpm 75 bpm    Resting BP 114/74 124/70    Resting Oxygen Saturation  99 % 96 %    Exercise Oxygen  Saturation  during 6 min walk 100 % 98 %    Max Ex. HR 107 bpm 108 bpm    Max Ex. BP 124/78 134/76    2 Minute Post BP 112/78 --             Oxygen Initial Assessment:   Oxygen Re-Evaluation:   Oxygen Discharge (Final Oxygen Re-Evaluation):   Initial Exercise Prescription:  Initial Exercise Prescription - 11/23/20 1300       Date of Initial Exercise RX and Referring Provider   Date 11/23/20    Referring Provider Glori Bickers MD      Treadmill   MPH 3.1    Grade 2    Minutes 15    METs 4.23      Recumbant Bike   Level 4    RPM 50    Watts 45    Minutes 15  METs 4.9      NuStep   Level 4    SPM 80    Minutes 15    METs 4.9      REL-XR   Level 4    Speed 50    Minutes 15    METs 4.9      Track   Laps 45    Minutes 15    METs 3.4      Prescription Details   Frequency (times per week) 3    Duration Progress to 30 minutes of continuous aerobic without signs/symptoms of physical distress      Intensity   THRR 40-80% of Max Heartrate 112-154    Ratings of Perceived Exertion 11-13    Perceived Dyspnea 0-4      Progression   Progression Continue to progress workloads to maintain intensity without signs/symptoms of physical distress.      Resistance Training   Training Prescription Yes    Weight 6 lb    Reps 10-15             Perform Capillary Blood Glucose checks as needed.  Exercise Prescription Changes:   Exercise Prescription Changes     Row Name 11/23/20 1300 11/28/20 1400 12/14/20 0800 12/27/20 1300 01/10/21 0700     Response to Exercise   Blood Pressure (Admit) 114/74 120/78 108/60 112/68 124/74   Blood Pressure (Exercise) 124/78 132/76 134/58 138/68 162/82   Blood Pressure (Exit) 112/78 94/62 104/72 102/66 106/72   Heart Rate (Admit) 71 bpm 104 bpm 74 bpm 85 bpm 85 bpm   Heart Rate (Exercise) 107 bpm 136 bpm 140 bpm 129 bpm 129 bpm   Heart Rate (Exit) 68 bpm 98 bpm 94 bpm 71 bpm 94 bpm   Oxygen Saturation (Admit) 99 %  -- -- -- --   Oxygen Saturation (Exercise) 100 % -- -- -- --   Oxygen Saturation (Exit) 99 % -- -- -- --   Rating of Perceived Exertion (Exercise) 11 -- _0 Perceived Dyspnea (Exercise) 1 -- -- -- --   Symptoms Slight SOB -- none none none   Comments walk test results second day -- -- --   Duration -- -- Continue with 30 min of aerobic exercise without signs/symptoms of physical distress. Continue with 30 min of aerobic exercise without signs/symptoms of physical distress. Continue with 30 min of aerobic exercise without signs/symptoms of physical distress.   Intensity -- -- THRR unchanged THRR unchanged THRR unchanged     Progression   Progression -- -- Continue to progress workloads to maintain intensity without signs/symptoms of physical distress. Continue to progress workloads to maintain intensity without signs/symptoms of physical distress. Continue to progress workloads to maintain intensity without signs/symptoms of physical distress.   Average METs -- -- 5.03 5.3 5.67     Resistance Training   Training Prescription -- Yes Yes Yes Yes   Weight -- 6 lb 7 lb 7 lb 7 lb   Reps -- 10-15 10-15 10-15 10-15     Interval Training   Interval Training -- -- Yes Yes Yes   Equipment -- -- Treadmill Treadmill Treadmill   Comments -- -- 1 min running intervals 1 min running intervals 1 min running intervals     Treadmill   MPH -- 3.5 4.3 3.5 4.5   Grade -- 3 2 4.5 2   Minutes -- _1 METs -- 5.13 6.8 5.85 8.5     NuStep  Level -- -- 5 -- --   Minutes -- -- 15 -- --   METs -- -- 3.6 -- --     Recumbant Elliptical   Level -- -- -- -- 5   Minutes -- -- -- -- 15   METs -- -- -- -- 4.3     Elliptical   Level -- -- -- -- 1   Speed -- -- -- -- 4   Minutes -- -- -- -- 15     REL-XR   Level -- _0 Minutes -- _1 METs -- 5.2 4.7 -- 3.9     Home Exercise Plan   Plans to continue exercise at -- -- Home (comment)  walking and gym at [place] (comment)   walking and gym at [place] (comment)  walking and gym at [facility]   Frequency -- -- Add 2 additional days to program exercise sessions. Add 2 additional days to program exercise sessions. Add 2 additional days to program exercise sessions.   Initial Home Exercises Provided -- -- 11/29/20 11/29/20 11/29/20            Exercise Comments:   Exercise Comments     Row Name 11/24/20 [phone] 01/25/21 0852         Exercise Comments First full day of exercise!  Patient was oriented to gym and equipment including functions, settings, policies, and procedures.  Patient's individual exercise prescription and treatment plan were reviewed.  All starting workloads were established based on the results of the 6 minute walk test done at initial orientation visit.  The plan for exercise progression was also introduced and progression will be customized based on patient's performance and goals. Harrell graduated today from  rehab with 30 sessions completed.  Details of the patient's exercise prescription and what He needs to do in order to continue the prescription and progress were discussed with patient.  Patient was given a copy of prescription and goals.  Patient verbalized understanding.  Tryston plans to continue to exercise by exercising at his gym.               Exercise Goals and Review:   Exercise Goals     Row Name 11/23/20 1401             Exercise Goals   Increase Physical Activity Yes       Intervention Provide advice, education, support and counseling about physical activity/exercise needs.;Develop an individualized exercise prescription for aerobic and resistive training based on initial evaluation findings, risk stratification, comorbidities and participant's personal goals.       Expected Outcomes Short Term: Attend rehab on a regular basis to increase amount of physical activity.;Long Term: Add in home exercise to make exercise part of routine and to increase amount of physical  activity.;Long Term: Exercising regularly at least 3-5 days a week.       Increase Strength and Stamina Yes       Intervention Provide advice, education, support and counseling about physical activity/exercise needs.;Develop an individualized exercise prescription for aerobic and resistive training based on initial evaluation findings, risk stratification, comorbidities and participant's personal goals.       Expected Outcomes Short Term: Perform resistance training exercises routinely during rehab and add in resistance training at home;Short Term: Increase workloads from initial exercise prescription for resistance, speed, and METs.;Long Term: Improve cardiorespiratory fitness, muscular endurance and strength as measured by increased METs and functional capacity (6MWT)  Able to understand and use rate of perceived exertion (RPE) scale Yes       Intervention Provide education and explanation on how to use RPE scale       Expected Outcomes Short Term: Able to use RPE daily in rehab to express subjective intensity level;Long Term:  Able to use RPE to guide intensity level when exercising independently       Able to understand and use Dyspnea scale Yes       Intervention Provide education and explanation on how to use Dyspnea scale       Expected Outcomes Long Term: Able to use Dyspnea scale to guide intensity level when exercising independently;Short Term: Able to use Dyspnea scale daily in rehab to express subjective sense of shortness of breath during exertion       Knowledge and understanding of Target Heart Rate Range (THRR) Yes       Intervention Provide education and explanation of THRR including how the numbers were predicted and where they are located for reference       Expected Outcomes Short Term: Able to state/look up THRR;Short Term: Able to use daily as guideline for intensity in rehab;Long Term: Able to use THRR to govern intensity when exercising independently       Able to check  pulse independently Yes       Intervention Provide education and demonstration on how to check pulse in carotid and radial arteries.;Review the importance of being able to check your own pulse for safety during independent exercise       Expected Outcomes Short Term: Able to explain why pulse checking is important during independent exercise;Long Term: Able to check pulse independently and accurately       Understanding of Exercise Prescription Yes       Intervention Provide education, explanation, and written materials on patient's individual exercise prescription       Expected Outcomes Long Term: Able to explain home exercise prescription to exercise independently;Short Term: Able to explain program exercise prescription                Exercise Goals Re-Evaluation :  Exercise Goals Re-Evaluation     Row Name 11/24/20 0836 11/28/20 1441 11/29/20 1202 12/13/20 0740 12/14/20 0819     Exercise Goal Re-Evaluation   Exercise Goals Review Able to understand and use rate of perceived exertion (RPE) scale;Knowledge and understanding of Target Heart Rate Range (THRR);Able to understand and use Dyspnea scale;Understanding of Exercise Prescription Increase Physical Activity;Increase Strength and Stamina Increase Physical Activity;Increase Strength and Stamina Increase Physical Activity;Increase Strength and Stamina Increase Physical Activity;Increase Strength and Stamina;Understanding of Exercise Prescription   Comments Reviewed RPE and dyspnea scales, THR and program prescription with pt today.  Pt voiced understanding and was given a copy of goals to take home. Inocencio has tolerated exercise well in first 2 sessions.  Staff will monitor progress. Reviewed home exercise with pt today.  Pt plans towalk and use gym at [facility] for exercise.  Reviewed THR, pulse, RPE, sign and symptoms, pulse oximetery and when to call 911 or MD.  Also discussed weather considerations and indoor options.  Pt voiced understanding.  Kayven has been walking a couple times a week outside program sessions.  He does check HR when exercising on his own. Kinston is doing well in rehab.  He will be out next week for vacation but promises to walk the beach while away. He is now running on intervals on the treadmill.  We  will continue to monitor his progress.   Expected Outcomes Short: Use RPE daily to regulate intensity. Long: Follow program prescription in THR. Short:  attend consistently Long:  improve overall stamina Short: monitor HR while exercising Long:  increase overall stamina Short: continue to exercise outsie program sessions Long: maintain exercise on his own Short: Continue to run intervals Long: Conitnue to improve stamina    Row Name 12/27/20 1328 01/10/21 0746 01/16/21 0725         Exercise Goal Re-Evaluation   Exercise Goals Review Increase Physical Activity;Increase Strength and Stamina Increase Physical Activity;Increase Strength and Stamina;Understanding of Exercise Prescription Increase Physical Activity;Increase Strength and Stamina;Understanding of Exercise Prescription     Comments Orland did walk and play golf while on vacation.  He did not feel good one day at the pool so we reviewed the importance of staying hydrated especially as the weather gets warmer. Rodriguez is doing well in rehab.  He is doing running intervals on the treadmill and doing intervals on his seated stuff as well. We will continue to monitor his progress. Alp is doing great. He is walking, playing golf and tennis 3+ days outside of rehab. Once he graduates, he plans to start exercising at the [employer] which will include both aerobic and resistance training. He checks his HR during exercise and meeting his THR. He plans to run a 5K sometime this summer as his goal.     Expected Outcomes Short: continue intervals Long:  improve stamina and MET level Short: Continue to use intervals even at home Long: Continue to improve stamina Short: Run a 5K Long:  Continue to exercise independently at appropriate exercise prescription              Discharge Exercise Prescription (Final Exercise Prescription Changes):  Exercise Prescription Changes - 01/10/21 0700       Response to Exercise   Blood Pressure (Admit) 124/74    Blood Pressure (Exercise) 162/82    Blood Pressure (Exit) 106/72    Heart Rate (Admit) 85 bpm    Heart Rate (Exercise) 129 bpm    Heart Rate (Exit) 94 bpm    Rating of Perceived Exertion (Exercise) 14    Symptoms none    Duration Continue with 30 min of aerobic exercise without signs/symptoms of physical distress.    Intensity THRR unchanged      Progression   Progression Continue to progress workloads to maintain intensity without signs/symptoms of physical distress.    Average METs 5.67      Resistance Training   Training Prescription Yes    Weight 7 lb    Reps 10-15      Interval Training   Interval Training Yes    Equipment Treadmill    Comments 1 min running intervals      Treadmill   MPH 4.5    Grade 2    Minutes 15    METs 8.5      Recumbant Elliptical   Level 5    Minutes 15    METs 4.3      Elliptical   Level 1    Speed 4    Minutes 15      REL-XR   Level 8    Minutes 15    METs 3.9      Home Exercise Plan   Plans to continue exercise at Home (comment)   walking and gym at [place] 2 additional days to program exercise sessions.  Initial Home Exercises Provided 11/29/20             Nutrition:  Target Goals: Understanding of nutrition guidelines, daily intake of sodium <1548m, cholesterol <2015m calories 30% from fat and 7% or less from saturated fats, daily to have 5 or more servings of fruits and vegetables.  Education: All About Nutrition: -Group instruction provided by verbal, written material, interactive activities, discussions, models, and posters to present general guidelines for heart healthy nutrition including fat, fiber, MyPlate, the role of sodium  in heart healthy nutrition, utilization of the nutrition label, and utilization of this knowledge for meal planning. Follow up email sent as well. Written material given at graduation. [facility]  Date 11/29/20  [facility] Code 1- Verbalizes Understanding       Biometrics:  Pre Biometrics - 11/23/20 1156       Pre Biometrics   Height 5' 10.5" (1.791 m)    Weight 212 lb (96.2 kg)    BMI (Calculated) 29.98    Single Leg Stand 30 seconds             Post Biometrics - 01/23/21 0754        Post  Biometrics   Height 5' 10.5" (1.791 m)    Weight 218 lb 9.6 oz (99.2 kg)    BMI (Calculated) 30.91    Single Leg Stand 30 seconds             Nutrition Therapy Plan and Nutrition Goals:  Nutrition Therapy & Goals - 12/04/20 0816       Nutrition Therapy   Diet Heart healthy, low Na    Protein (specify units) 75g    Fiber 30 grams    Whole Grain Foods 3 servings    Saturated Fats 12 max. grams    Fruits and Vegetables 8 servings/day    Sodium 1.5 grams      Personal Nutrition Goals   Nutrition Goal ST: limit red meat to 1x/week, eat a CHO at every meal LT: limit red meat a couple times per month, increase food variety (starchy vegetables, beans, all the colors of fruits/vegetables in a week)    Comments V8 (low Na) L: sandwich (whole wheat) or go out to eat (slaad with grilled chicken) D: steak or pork or grilled chicken or ground tuKuwaitr salmon and salad, sometimes pasta, squash and zucchini and onions as well as brussells sprouts. They have quinoa and sometimes cauliflower rice. Usually canola oil. Reviewed heart healthy eating.      Intervention Plan   Intervention Prescribe, educate and counsel regarding individualized specific dietary modifications aiming towards targeted core components such as weight, hypertension, lipid management, diabetes, heart failure and other  comorbidities.;Nutrition handout(s) given to patient.    Expected Outcomes Short Term Goal: Understand basic principles of dietary content, such as calories, fat, sodium, cholesterol and nutrients.;Short Term Goal: A plan has been developed with personal nutrition goals set during dietitian appointment.;Long Term Goal: Adherence to prescribed nutrition plan.             Nutrition Assessments:  MEDIFICTS Score Key: ?70 Need to make dietary changes  40-70 Heart Healthy Diet ? 40 Therapeutic Level Cholesterol Diet  [facility]  Picture Your Plate Total Score on Admission 63  Picture Your Plate Total Score on Discharge 72      Picture Your Plate  Scores: <40 Unhealthy dietary pattern with much room for improvement. 41-50 Dietary pattern unlikely to meet recommendations for good health and room for improvement. 51-60 More healthful dietary pattern, with some room for improvement.  >60 Healthy dietary pattern, although there may be some specific behaviors that could be improved.    Nutrition Goals Re-Evaluation:  Nutrition Goals Re-Evaluation     [place] Name 01/16/21 0729             Goals   Nutrition Goal ST: limit red meat to 1x/week, eat a CHO at every meal LT: limit red meat a couple times per month, increase food variety (starchy vegetables, beans, all the colors of fruits/vegetables in a week)       Comment Duston is doing well. He has eliminated red meat from his diet for the most part. He is drinking more water and eating more fruit as sneaks and sides for meals. He is hoping to lose some weight by making these changes.       Expected Outcome Short: Continue to follow goals established by RD Long: Continue to follow heart healthy diet                Nutrition Goals Discharge (Final Nutrition Goals Re-Evaluation):  Nutrition Goals Re-Evaluation - 01/16/21 0729       Goals   Nutrition Goal ST: limit red  meat to 1x/week, eat a CHO at every meal LT: limit red meat a couple times per month, increase food variety (starchy vegetables, beans, all the colors of fruits/vegetables in a week)    Comment Terre is doing well. He has eliminated red meat from his diet for the most part. He is drinking more water and eating more fruit as sneaks and sides for meals. He is hoping to lose some weight by making these changes.    Expected Outcome Short: Continue to follow goals established by RD Long: Continue to follow heart healthy diet             Psychosocial: Target Goals: Acknowledge presence or absence of significant depression and/or stress, maximize coping skills, provide positive support system. Participant is able to verbalize types and ability to use techniques and skills needed for reducing stress and depression.   Education: Stress, Anxiety, and Depression - Group verbal and visual presentation to define topics covered.  Reviews how body is impacted by stress, anxiety, and depression.  Also discusses healthy ways to reduce stress and to treat/manage anxiety and depression.  Written material given at graduation. Flowsheet Row Cardiac Rehab from 01/18/2021 in [facility] Cardiac and Pulmonary Rehab  Date 12/28/20  Educator [facility]  Instruction Review Code 1- [employer] Understanding       Education: Sleep Hygiene -Provides group verbal and written instruction about how sleep can affect your health.  Define sleep hygiene, discuss sleep cycles and impact of sleep habits. Review good sleep hygiene tips.    Initial Review & Psychosocial Screening:  Initial Psych Review & Screening - 11/22/20 1536       Initial Review   Current issues with Current Stress Concerns    Source of Stress Concerns Unable to participate in former interests or hobbies      Gonzalez? Yes   wife     Barriers   Psychosocial barriers to participate in program There are no identifiable barriers or  psychosocial needs.      Screening Interventions   Expected Outcomes Short Term goal: Utilizing psychosocial counselor, staff  and physician to assist with identification of specific Stressors or current issues interfering with healing process. Setting desired goal for each stressor or current issue identified.;Long Term Goal: Stressors or current issues are controlled or eliminated.;Short Term goal: Identification and review with participant of any Quality of Life or Depression concerns found by scoring the questionnaire.;Long Term goal: The participant improves quality of Life and PHQ9 Scores as seen by post scores and/or verbalization of changes             Quality of Life Scores:   Quality of Life - 01/25/21 0938       Quality of Life Scores   Health/Function Pre 25.6 %    Health/Function Post 28 %    Health/Function % Change 9.38 %    Socioeconomic Pre 21.75 %    Socioeconomic Post 29.25 %    Socioeconomic % Change  34.48 %    Psych/Spiritual Pre 24 %    Psych/Spiritual Post 24.14 %    Psych/Spiritual % Change 0.58 %    Family Pre 30 %    Family Post 30 %    Family % Change 0 %    GLOBAL Pre 25.03 %    GLOBAL Post 28.8 %    GLOBAL % Change 15.06 %            Scores of 19 and below usually indicate a poorer quality of life in these areas.  A difference of  2-3 points is a clinically meaningful difference.  A difference of 2-3 points in the total score of the Quality of Life Index has been associated with significant improvement in overall quality of life, self-image, physical symptoms, and general health in studies assessing change in quality of life.  PHQ-9: Recent Review Flowsheet Data     Depression screen [facility] 2/9 01/25/2021 11/23/2020   Decreased Interest 0 1   Down, Depressed, Hopeless 0 1   PHQ - 2 Score 0 2   Altered sleeping 0 0   Tired, decreased energy 0 1   Change in appetite 0 1   Feeling bad or failure about yourself  0 1   Trouble concentrating 0 1    Moving slowly or fidgety/restless 0 0   Suicidal thoughts 0 0   PHQ-9 Score 0 6   Difficult doing work/chores Not difficult at all Not difficult at all      Interpretation of Total Score  Total Score Depression Severity:  1-4 = Minimal depression, 5-9 = Mild depression, 10-14 = Moderate depression, 15-19 = Moderately severe depression, 20-27 = Severe depression   Psychosocial Evaluation and Intervention:  Psychosocial Evaluation - 11/22/20 1543       Psychosocial Evaluation & Interventions   Interventions Encouraged to exercise with the program and follow exercise prescription    Comments Fabricio reports feeling better after his stent. His symptoms started around Thanksgiving so he is thankful to be feeling better. Prior to his initial symptoms, he was preparing to run a half marathon and since his symptoms started he has become more sedentary. He is looking forward to getting back into an exercise routine with the help and supervision of the cardiac rehab staff. 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 His wife and kids are his main support system. He is excited to start the program as he is ready to get back to his hobbies without having his anginal and shortness of breath symptoms.  Expected Outcomes Short: attend cardiac rehab for education and exercise. Long: develop and maintain positive self care habits.    Continue Psychosocial Services  Follow up required by staff             Psychosocial Re-Evaluation:  Psychosocial Re-Evaluation     [place] Name 12/13/20 0741 01/16/21 0730           Psychosocial Re-Evaluation   Current issues with -- None Identified      Comments Severiano reports no stress anxiety or depression concerns.  He states he sleeps well. Johnmichael is doing well mentally. His sleep is good and does not have any problems. He has good support from family and  friends. He does not take any medications for depression or anxiety and denies  any other concerns or issues at this time. He is excited to run his first 5K this summer after talking to his doctor about it.      Expected Outcomes Short: continue to attend program sessions tp help manage stress Long: maintain positive outlook Short: Continue attendance with rehab Long: Maintain positive attitude and use exercise for stress management      Interventions -- Encouraged to attend Cardiac Rehabilitation for the exercise      Continue Psychosocial Services  -- Follow up required by staff               Psychosocial Discharge (Final Psychosocial Re-Evaluation):  Psychosocial Re-Evaluation - 01/16/21 0730       Psychosocial Re-Evaluation   Current issues with None Identified    Comments Atiba is doing well mentally. His sleep is good and does not have any problems. He has good support from family and  friends. He does not take any medications for depression or anxiety and denies any other concerns or issues at this time. He is excited to run his first 5K this summer after talking to his doctor about it.    Expected Outcomes Short: Continue attendance with rehab Long: Maintain positive attitude and use exercise for stress management    Interventions Encouraged to attend Cardiac Rehabilitation for the exercise    Continue Psychosocial Services  Follow up required by staff             Vocational Rehabilitation: Provide vocational rehab assistance to qualifying candidates.   Vocational Rehab Evaluation & Intervention:  Vocational Rehab - 01/25/21 0937       Discharge Vocational Rehab   Discharge Vocational Rehabilitation no need             Education: Education Goals: Education classes will be provided on a variety of topics geared toward better understanding of heart health and risk factor modification. Participant will state understanding/return demonstration of topics presented as noted by education test scores.  Learning Barriers/Preferences:  Learning  Barriers/Preferences - 11/22/20 1536       Learning Barriers/Preferences   Learning Barriers None    Learning Preferences None             General Cardiac Education Topics:  AED/CPR: - Group verbal and written instruction with the use of models to demonstrate the basic use of the AED with the basic ABC's of resuscitation.   Anatomy and Cardiac Procedures: - Group verbal and visual presentation and models provide information about basic cardiac anatomy and function. Reviews the testing methods done to diagnose heart disease and the outcomes of the test results. Describes the treatment choices: Medical Management, Angioplasty, or Coronary Bypass Surgery for treating various heart conditions  including Myocardial Infarction, Angina, Valve Disease, and Cardiac Arrhythmias.  Written material given at graduation. Flowsheet Row Cardiac Rehab from 01/18/2021 in [facility] Cardiac and Pulmonary Rehab  Date 01/18/21  Educator SB  Instruction Review Code 1- Verbalizes Understanding       Medication Safety: - Group verbal and visual instruction to review commonly prescribed medications for heart and lung disease. Reviews the medication, class of the drug, and side effects. Includes the steps to properly store meds and maintain the prescription regimen.  Written material given at graduation. Flowsheet Row Cardiac Rehab from 01/18/2021 in [facility] Cardiac and Pulmonary Rehab  Date 12/07/20  Educator SB  Instruction Review Code 1- Verbalizes Understanding       Intimacy: - Group verbal instruction through game format to discuss how heart and lung disease can affect sexual intimacy. Written material given at graduation..   Know Your Numbers and Heart Failure: - Group verbal and visual instruction to discuss disease risk factors for cardiac and pulmonary disease and treatment options.  Reviews associated critical values for Overweight/Obesity, Hypertension, Cholesterol, and Diabetes.  Discusses basics  of heart failure: signs/symptoms and treatments.  Introduces Heart Failure Zone chart for action plan for heart failure.  Written material given at graduation. Flowsheet Row Cardiac Rehab from 01/18/2021 in [facility] Cardiac and Pulmonary Rehab  Date 12/13/20  Educator [facility]  Instruction Review Code 1- Verbalizes Understanding       Infection Prevention: - Provides verbal and written material to individual with discussion of infection control including proper hand washing and proper equipment cleaning during exercise session. Flowsheet Row Cardiac Rehab from 01/18/2021 in [facility] Cardiac and Pulmonary Rehab  Education need identified 11/23/20  Date 11/23/20  Educator [place]  Instruction Review Code 1- Verbalizes Understanding       Falls Prevention: - Provides verbal and written material to individual with discussion of falls prevention and safety. Flowsheet Row Cardiac Rehab from 01/18/2021 in [facility] Cardiac and Pulmonary Rehab  Education need identified 11/23/20  Date 11/23/20  Educator [place]  Instruction Review Code 1- Verbalizes Understanding       Other: -Provides group and verbal instruction on various topics (see comments)   Knowledge Questionnaire Score:  Knowledge Questionnaire Score - 01/25/21 7591       Knowledge Questionnaire Score   Pre Score 23/26: [place], nutrition    Post Score 26/26             Core Components/Risk Factors/Patient Goals at Admission:  Personal Goals and Risk Factors at Admission - 11/23/20 1401       Core Components/Risk Factors/Patient Goals on Admission    Weight Management Yes;Weight Loss    Intervention Weight Management: Develop a combined nutrition and exercise program designed to reach desired caloric intake, while maintaining appropriate intake of nutrient and fiber, sodium and fats, and appropriate energy expenditure required for the weight goal.;Weight Management: Provide education and appropriate resources to help participant work on and  attain dietary goals.;Weight Management/Obesity: Establish reasonable short term and long term weight goals.    Admit Weight 212 lb (96.2 kg)    Goal Weight: Short Term 207 lb (93.9 kg)    Goal Weight: Long Term 190 lb (86.2 kg)    Expected Outcomes Short Term: Continue to assess and modify interventions until short term weight is achieved;Long Term: Adherence to nutrition and physical activity/exercise program aimed toward attainment of established weight goal;Weight Loss: Understanding of general recommendations for a balanced deficit meal plan, which promotes 1-2 lb weight loss per  week and includes a negative energy balance of [phone] kcal/d;Understanding recommendations for meals to include 15-35% energy as protein, 25-35% energy from fat, 35-60% energy from carbohydrates, less than 250m of dietary cholesterol, 20-35 gm of total fiber daily;Understanding of distribution of calorie intake throughout the day with the consumption of 4-5 meals/snacks    Hypertension Yes    Intervention Provide education on lifestyle modifcations including regular physical activity/exercise, weight management, moderate sodium restriction and increased consumption of fresh fruit, vegetables, and low fat dairy, alcohol moderation, and smoking cessation.;Monitor prescription use compliance.    Expected Outcomes Short Term: Continued assessment and intervention until BP is < 140/959mHG in hypertensive participants. < 130/8010mG in hypertensive participants with diabetes, heart failure or chronic kidney disease.;Long Term: Maintenance of blood pressure at goal levels.    Lipids Yes    Intervention Provide education and support for participant on nutrition & aerobic/resistive exercise along with prescribed medications to achieve LDL <62m23mDL >40mg83m Expected Outcomes Long Term: Cholesterol controlled with medications as prescribed, with individualized exercise RX and with personalized nutrition plan. Value goals: LDL <  62mg,31m > 40 mg.;Short Term: Participant states understanding of desired cholesterol values and is compliant with medications prescribed. Participant is following exercise prescription and nutrition guidelines.             Education:Diabetes - Individual verbal and written instruction to review signs/symptoms of diabetes, desired ranges of glucose level fasting, after meals and with exercise. Acknowledge that pre and post exercise glucose checks will be done for 3 sessions at entry of program.   Core Components/Risk Factors/Patient Goals Review:   Goals and Risk Factor Review     Row Name 12/13/20 0736 12/13/20 0738 01/16/21 0726         Core Components/Risk Factors/Patient Goals Review   Personal Goals Review Weight Management/Obesity;Lipids;Hypertension -- Weight Management/Obesity;Lipids;Hypertension     Review Jessica Jurgenking meds as directed.  BP resting today was 108/60. He reports weight is staying steady.  He states he eats heart healthy most of the time. Dhyan Nyheemighing himself at home, he gained weight while he was in the hospital for his last event and wants to lose weight. He is currently at 215 lb, wants to get down to 190 lb. He is hoping his continuous exercise helps. He checks his BP 3x/week and knows to check if he feels symptomatic. His pressure has been stable at home and at rehab. In 2 weeks, he is getting his cholesterol checked with with PCP and following up with them at the end of July for new values     Expected Outcomes -- Short:  continue to take meds as directed and monitor risk factors Long: manage risk factors and keep at optimal levels Short: Get cholesterol checked and review with doctor Long: Continue to manage lifestyle risk factors              Core Components/Risk Factors/Patient Goals at Discharge (Final Review):   Goals and Risk Factor Review - 01/16/21 0726       Core Components/Risk Factors/Patient Goals Review   Personal Goals Review  Weight Management/Obesity;Lipids;Hypertension    Review Ellwyn Manvirighing himself at home, he gained weight while he was in the hospital for his last event and wants to lose weight. He is currently at 215 lb, wants to get down to 190 lb. He is hoping his continuous exercise helps. He checks his BP 3x/week and knows to check if  he feels symptomatic. His pressure has been stable at home and at rehab. In 2 weeks, he is getting his cholesterol checked with with PCP and following up with them at the end of July for new values    Expected Outcomes Short: Get cholesterol checked and review with doctor Long: Continue to manage lifestyle risk factors             ITP Comments:  ITP Comments     Row Name 11/22/20 1540 11/23/20 0948 11/24/20 0834 12/06/20 0714 01/03/21 0811   ITP Comments Initial telephone orientation completed. Diagnosis can be found in [facility] 4/25. EP orientation scheduled for Thursday 5/5 at 8am. Completed 6MWT and gym orientation. Initial ITP created and sent for review to Dr. Emily Filbert, Medical Director. First full day of exercise!  Patient was oriented to gym and equipment including functions, settings, policies, and procedures.  Patient's individual exercise prescription and treatment plan were reviewed.  All starting workloads were established based on the results of the 6 minute walk test done at initial orientation visit.  The plan for exercise progression was also introduced and progression will be customized based on patient's performance and goals. 30 Day review completed. Medical Director ITP review done, changes made as directed, and signed approval by Medical Director.  New to program 30 Day review completed. Medical Director ITP review done, changes made as directed, and signed approval by Medical Director.    Row Name 01/25/21 0852           ITP Comments Christion graduated today from  rehab with 30 sessions completed.  Details of the patient's exercise prescription and what He  needs to do in order to continue the prescription and progress were discussed with patient.  Patient was given a copy of prescription and goals.  Patient verbalized understanding.  Kervens plans to continue to exercise by exercising at his gym.                Comments: DIscharge ITP

## 2021-01-25 NOTE — Progress Notes (Signed)
Daily Session Note  Patient Details  Name: Lance Terry MRN: [id number] Date of Birth: Sep 13, 1975 Referring Provider:   Flowsheet Row Cardiac Rehab from 11/23/2020 in [facility] Cardiac and Pulmonary Rehab  Referring Provider Glori Bickers MD       Encounter Date: 01/25/2021  Check In:  Session Check In - 01/25/21 0846       Check-In   Supervising physician immediately available to respond to emergencies See telemetry face sheet for immediately available ER MD    Location [facility]-Cardiac & Pulmonary Rehab    Staff Present Heath Lark, RN, BSN, CCRP;Kelly Bollinger, MPA, RN;Melissa [place], RDN, LDN;Jessica Cawood, MA, RCEP, CCRP, CCET    Virtual Visit No    Medication changes reported     No    Fall or balance concerns reported    No    Warm-up and Cool-down Performed on first and last piece of equipment    Resistance Training Performed Yes    VAD Patient? No    PAD/SET Patient? No      Pain Assessment   Currently in Pain? No/denies                Social History   Tobacco Use  Smoking Status Former   Pack years: 0.00  Smokeless Tobacco Never    Goals Met:  Independence with exercise equipment Exercise tolerated well Personal goals reviewed No report of cardiac concerns or symptoms  Goals Unmet:  Not Applicable  Comments:  Lance Terry graduated today from  rehab with 30 sessions completed.  Details of the patient's exercise prescription and what He needs to do in order to continue the prescription and progress were discussed with patient.  Patient was given a copy of prescription and goals.  Patient verbalized understanding.  Lance Terry plans to continue to exercise by exercising at his gym.   Dr. Emily Filbert is Medical Director for [facility].  Dr. Ottie Glazier is Medical Director for [facility] Pulmonary Rehabilitation.

## 2021-01-26 ENCOUNTER — Ambulatory Visit: Payer: BC Managed Care – PPO

## 2021-02-06 DIAGNOSIS — I10 Essential (primary) hypertension: Secondary | ICD-10-CM | POA: Diagnosis not present

## 2021-02-06 DIAGNOSIS — E785 Hyperlipidemia, unspecified: Secondary | ICD-10-CM | POA: Diagnosis not present

## 2021-02-07 DIAGNOSIS — I10 Essential (primary) hypertension: Secondary | ICD-10-CM | POA: Diagnosis not present

## 2021-02-07 DIAGNOSIS — E785 Hyperlipidemia, unspecified: Secondary | ICD-10-CM | POA: Diagnosis not present

## 2021-02-13 DIAGNOSIS — I10 Essential (primary) hypertension: Secondary | ICD-10-CM | POA: Diagnosis not present

## 2021-02-13 DIAGNOSIS — E785 Hyperlipidemia, unspecified: Secondary | ICD-10-CM | POA: Diagnosis not present

## 2021-02-13 DIAGNOSIS — I2510 Atherosclerotic heart disease of native coronary artery without angina pectoris: Secondary | ICD-10-CM | POA: Diagnosis not present

## 2021-02-13 DIAGNOSIS — M25512 Pain in left shoulder: Secondary | ICD-10-CM | POA: Diagnosis not present

## 2021-02-26 ENCOUNTER — Other Ambulatory Visit (HOSPITAL_COMMUNITY): Payer: Self-pay | Admitting: Internal Medicine

## 2021-04-03 ENCOUNTER — Ambulatory Visit (HOSPITAL_COMMUNITY): Payer: BC Managed Care – PPO

## 2021-04-03 ENCOUNTER — Encounter (HOSPITAL_COMMUNITY): Payer: BC Managed Care – PPO | Admitting: Internal Medicine

## 2021-04-05 ENCOUNTER — Other Ambulatory Visit: Payer: Self-pay | Admitting: Cardiovascular Disease

## 2021-04-05 NOTE — Telephone Encounter (Signed)
Please schedule overdue F/U appointment. Thank you! ?

## 2021-04-05 NOTE — Telephone Encounter (Signed)
Patient states appt not needed under other cardiologist provider care

## 2021-04-27 ENCOUNTER — Other Ambulatory Visit (HOSPITAL_COMMUNITY): Payer: Self-pay | Admitting: *Deleted

## 2021-04-27 MED ORDER — LISINOPRIL 20 MG PO TABS: ORAL_TABLET | ORAL | 3 refills | Status: DC

## 2021-05-02 ENCOUNTER — Other Ambulatory Visit: Payer: Self-pay | Admitting: Cardiovascular Disease

## 2021-05-02 NOTE — Telephone Encounter (Signed)
Attempted to schedule, patient states no longer a patient.

## 2021-05-02 NOTE — Telephone Encounter (Signed)
Please schedule overdue F/U appointment. Thank you! ?

## 2021-05-03 ENCOUNTER — Other Ambulatory Visit: Payer: Self-pay | Admitting: Cardiovascular Disease

## 2021-05-03 NOTE — Telephone Encounter (Signed)
Patient does not wish to follow up

## 2021-05-03 NOTE — Telephone Encounter (Signed)
Please contact pt for 1 month f/u. Pt overdue for appointment.

## 2021-05-04 NOTE — Telephone Encounter (Signed)
Patient is schedule to see Dr. Haroldine Laws on 06/07/21 at 12 PM along with an echo scheduled for same day.

## 2021-05-09 ENCOUNTER — Other Ambulatory Visit: Payer: Self-pay | Admitting: Internal Medicine

## 2021-05-15 ENCOUNTER — Other Ambulatory Visit: Payer: Self-pay

## 2021-05-15 MED ORDER — ROSUVASTATIN CALCIUM 40 MG PO TABS: 40.0000 mg | ORAL_TABLET | Freq: Every day | ORAL | 0 refills | Status: DC

## 2021-05-21 ENCOUNTER — Telehealth: Payer: Self-pay | Admitting: Cardiovascular Disease

## 2021-05-21 NOTE — Telephone Encounter (Signed)
-----   Message from Horton Finer sent at 05/15/2021  8:14 AM EDT ----- Regarding: needs appointment Please schedule 12 month F/U appointment for refills. Thank you!

## 2021-05-21 NOTE — Telephone Encounter (Signed)
Attempted to schedule no ans no vm  

## 2021-06-07 ENCOUNTER — Ambulatory Visit (HOSPITAL_BASED_OUTPATIENT_CLINIC_OR_DEPARTMENT_OTHER)
Admission: RE | Admit: 2021-06-07 | Discharge: 2021-06-07 | Disposition: A | Discharge status: Home / Self Care | Payer: BC Managed Care – PPO | Source: Ambulatory Visit | Attending: Internal Medicine | Admitting: Internal Medicine

## 2021-06-07 ENCOUNTER — Other Ambulatory Visit: Payer: Self-pay

## 2021-06-07 ENCOUNTER — Other Ambulatory Visit: Payer: Self-pay | Admitting: Cardiovascular Disease

## 2021-06-07 ENCOUNTER — Encounter (HOSPITAL_COMMUNITY): Payer: Self-pay | Admitting: Internal Medicine

## 2021-06-07 ENCOUNTER — Ambulatory Visit (HOSPITAL_COMMUNITY)
Admission: RE | Admit: 2021-06-07 | Discharge: 2021-06-07 | Disposition: A | Discharge status: Home / Self Care | Payer: BC Managed Care – PPO | Source: Ambulatory Visit | Attending: Internal Medicine | Admitting: Internal Medicine

## 2021-06-07 VITALS — BP 150/98 | Wt 207.2 lb

## 2021-06-07 DIAGNOSIS — I340 Nonrheumatic mitral (valve) insufficiency: Secondary | ICD-10-CM | POA: Diagnosis not present

## 2021-06-07 DIAGNOSIS — I252 Old myocardial infarction: Secondary | ICD-10-CM | POA: Diagnosis not present

## 2021-06-07 DIAGNOSIS — I2510 Atherosclerotic heart disease of native coronary artery without angina pectoris: Secondary | ICD-10-CM | POA: Insufficient documentation

## 2021-06-07 DIAGNOSIS — Z79899 Other long term (current) drug therapy: Secondary | ICD-10-CM | POA: Insufficient documentation

## 2021-06-07 DIAGNOSIS — Z7902 Long term (current) use of antithrombotics/antiplatelets: Secondary | ICD-10-CM | POA: Diagnosis not present

## 2021-06-07 DIAGNOSIS — R0683 Snoring: Secondary | ICD-10-CM | POA: Insufficient documentation

## 2021-06-07 DIAGNOSIS — Q250 Patent ductus arteriosus: Secondary | ICD-10-CM | POA: Insufficient documentation

## 2021-06-07 DIAGNOSIS — I509 Heart failure, unspecified: Secondary | ICD-10-CM | POA: Insufficient documentation

## 2021-06-07 DIAGNOSIS — R0681 Apnea, not elsewhere classified: Secondary | ICD-10-CM | POA: Diagnosis not present

## 2021-06-07 DIAGNOSIS — I110 Hypertensive heart disease with heart failure: Secondary | ICD-10-CM | POA: Insufficient documentation

## 2021-06-07 DIAGNOSIS — E782 Mixed hyperlipidemia: Secondary | ICD-10-CM | POA: Insufficient documentation

## 2021-06-07 DIAGNOSIS — R079 Chest pain, unspecified: Secondary | ICD-10-CM | POA: Diagnosis not present

## 2021-06-07 DIAGNOSIS — I10 Essential (primary) hypertension: Secondary | ICD-10-CM

## 2021-06-07 DIAGNOSIS — Z8616 Personal history of COVID-19: Secondary | ICD-10-CM | POA: Diagnosis not present

## 2021-06-07 DIAGNOSIS — Z955 Presence of coronary angioplasty implant and graft: Secondary | ICD-10-CM | POA: Insufficient documentation

## 2021-06-07 LAB — ECHOCARDIOGRAM COMPLETE
Area-P 1/2: 3.53 cm2
Calc EF: 62.2 %
S' Lateral: 2.4 cm
Single Plane A2C EF: 56.1 %
Single Plane A4C EF: 65.9 %

## 2021-06-07 NOTE — Telephone Encounter (Signed)
Please schedule overdue office visit for refills. Thank you!

## 2021-06-07 NOTE — Patient Instructions (Signed)
Please call our office in Jan 2023 to get the home sleep study equipment  Your physician has requested that you regularly monitor and record your blood pressure readings at home. Please use the same machine at the same time of day to check your readings and record them to bring to your follow-up visit.  Your physician recommends that you schedule a follow-up appointment in: 6 months, **PLEASE CALL OUR OFFICE IN MARCH TO SCHEDULE THIS APPOINTMENT  If you have any questions or concerns before your next appointment please send [place] a message through [place] or call our office at [phone].    TO LEAVE A MESSAGE FOR THE NURSE SELECT OPTION 2, PLEASE LEAVE A MESSAGE INCLUDING: YOUR NAME DATE OF BIRTH CALL BACK NUMBER REASON FOR CALL**this is important as we prioritize the call backs  YOU WILL RECEIVE A CALL BACK THE SAME DAY AS LONG AS YOU CALL BEFORE 4:00 PM

## 2021-06-07 NOTE — Progress Notes (Signed)
CARDIOLOGY CLINIC VISIT  PCP: Dr. 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 Cath from 7/10 mid LAD lesion  50-60%.   Echo 1/22 EF 55-60%   Underwent cath 11/13/20 for + CT scan  1. LM ok 2. LAD 99% mid LAD lesion at bifurcation of large diagonal. -> PCI with DES by Dr. Burt Knack 3. LCk ok 4. RCA small PDA occluded at ostium with L to R collaterals  Went to [place]. Was feeling great. Running and playing golf. Then got COVID 3 months ago and has slowed down since. No CP or SOB. Watching diet closely   Wife says he snores frequently and has occasional periods of apparent apnea.  Echo today EF 60-65% RV normal. Personally reviewed  Lipid Panel  7/22 TC 114 HDL 36 LDL 51 TG 133   ROS: All systems negative except as listed in HPI, PMH and Problem List.  Past Medical History:  Diagnosis Date   Coronary artery disease    -small NSTEMI in setting of heat stroke 7/10 -Cath 50-60% LAD; 7/10 -ET.Lance Terry: EF 71% no ischemia 7/10    Hyperlipidemia    mixed   Hypertension     Current Outpatient Medications  Medication Sig Dispense Refill   aspirin 81 MG tablet Take 81 mg by mouth daily.     clopidogrel (PLAVIX) 75 MG tablet Take 1 tablet (75 mg total) by mouth daily. 30 tablet 6   ezetimibe (ZETIA) 10 MG tablet TAKE 1 TABLET BY MOUTH EVERY DAY 90 tablet 3   icosapent Ethyl (VASCEPA) 1 g capsule Take 1 g by mouth 2 (two) times daily.     lisinopril (ZESTRIL) 20 MG tablet TAKE 1 TABLET (20 MG TOTAL) BY MOUTH DAILY. 90 tablet 3   metoprolol succinate (TOPROL-XL) 25 MG 24 hr tablet Take 1 tablet (25 mg total) by mouth daily. 90 tablet 3   Multiple Vitamins-Minerals (MULTIVITAL) tablet Take 1 tablet by mouth daily.     nitroGLYCERIN (NITROSTAT) 0.4 MG SL tablet Place 1 tablet (0.4 mg total) under the tongue every 5 (five) minutes as needed for chest pain. 25 tablet 3   rosuvastatin (CRESTOR)  40 MG tablet Take 1 tablet (40 mg total) by mouth daily. Please call to schedule office visit for further refills. Thank you! 30 tablet 0   No current facility-administered medications for this encounter.     PHYSICAL EXAM: Vitals:   06/07/21 1210  BP: (!) 150/98  Weight: 94 kg (207 lb 3.2 oz)   General:  Well appearing. No resp difficulty HEENT: normal Neck: supple. no JVD. Carotids 2+ bilat; no bruits. No lymphadenopathy or thryomegaly appreciated. Cor: PMI nondisplaced. Regular rate & rhythm. No rubs, gallops or murmurs. Lungs: clear Abdomen: soft, nontender, nondistended. No hepatosplenomegaly. No bruits or masses. Good bowel sounds. Extremities: no cyanosis, clubbing, rash, edema Neuro: alert & orientedx3, cranial nerves grossly intact. moves all 4 extremities w/o difficulty. Affect pleasant   ECG: NSR 69  No ST-T wave abnormalities. Personally reviewed   ASSESSMENT & PLAN:  1. CAD  - Cath 4/22 with 99% mLAD -> s/p PCI DES - continue DAPT, statin and zetia. Will need Plavix at least 1 year  - No s/s angina - Keep LDL < 70 - echo today EF 60-65% Personally reviewed  2. HTN - Blood pressure elevated. Check home BPs. If SBP persistently > 140. Let me  know and we will start losartan  3. HL - Followed by Dr. Inda Merlin. Continue Crestor, zetia and Vascepa. Goal LDL < 70. - last LDL 23  4. Snoring - likely OSA - Check WatchPat HST   Benay Spice 12:43 PM

## 2021-06-07 NOTE — Progress Notes (Signed)
  Echocardiogram 2D Echocardiogram has been performed.  Lance Terry 06/07/2021, 11:42 AM

## 2021-06-08 ENCOUNTER — Other Ambulatory Visit (HOSPITAL_COMMUNITY): Payer: Self-pay | Admitting: *Deleted

## 2021-06-08 MED ORDER — CLOPIDOGREL BISULFATE 75 MG PO TABS: 75.0000 mg | ORAL_TABLET | Freq: Every day | ORAL | 6 refills | Status: DC

## 2021-06-08 MED ORDER — METOPROLOL SUCCINATE ER 25 MG PO TB24: 25.0000 mg | ORAL_TABLET | Freq: Every day | ORAL | 3 refills | Status: DC

## 2021-06-08 MED ORDER — LISINOPRIL 20 MG PO TABS: ORAL_TABLET | ORAL | 3 refills | Status: DC

## 2021-06-08 NOTE — Telephone Encounter (Signed)
Patient is seeing another provider, requesting to be removed

## 2021-06-28 ENCOUNTER — Other Ambulatory Visit (HOSPITAL_COMMUNITY): Payer: Self-pay | Admitting: *Deleted

## 2021-06-28 MED ORDER — ROSUVASTATIN CALCIUM 40 MG PO TABS: 40.0000 mg | ORAL_TABLET | Freq: Every day | ORAL | 3 refills | Status: DC

## 2021-09-17 ENCOUNTER — Other Ambulatory Visit (HOSPITAL_COMMUNITY): Payer: Self-pay | Admitting: Surgery

## 2021-09-17 ENCOUNTER — Telehealth (HOSPITAL_COMMUNITY): Payer: Self-pay | Admitting: Surgery

## 2021-09-17 ENCOUNTER — Telehealth (HOSPITAL_COMMUNITY): Payer: Self-pay | Admitting: *Deleted

## 2021-09-17 DIAGNOSIS — R0683 Snoring: Secondary | ICD-10-CM

## 2021-09-17 NOTE — Telephone Encounter (Signed)
Pt left vm stating he was told to call when he was ready to do home sleep study. Pt asked how to pick up device and use it.   No pre cert reqd  Routed to [facility] to follow up with pt

## 2021-09-17 NOTE — Telephone Encounter (Signed)
Patient called and will return tomorrow  to [facility] for a nurse visit to receive the home sleep device and instructions for completion per Dr. Clayborne Dana recommendation.

## 2021-09-18 ENCOUNTER — Other Ambulatory Visit: Payer: Self-pay

## 2021-09-18 ENCOUNTER — Ambulatory Visit (HOSPITAL_COMMUNITY)
Admission: RE | Admit: 2021-09-18 | Discharge: 2021-09-18 | Disposition: A | Discharge status: Home / Self Care | Payer: BC Managed Care – PPO | Source: Ambulatory Visit | Attending: Internal Medicine | Admitting: Internal Medicine

## 2021-09-18 DIAGNOSIS — R0683 Snoring: Secondary | ICD-10-CM | POA: Diagnosis present

## 2021-09-18 NOTE — Progress Notes (Signed)
Patient came to clinic to get home sleep device and directions to complete study.  He successfully downloaded application for his phone.  I encouraged him to complete the study within 1 week and call back with any concerns or questions.  Referring Provider: Bensimhon  Today's Date: 09/18/2021  STOP BANG RISK ASSESSMENT S (snore) Have you been told that you snore?     YES   T (tired) Are you often tired, fatigued, or sleepy during the day?   YES  O (obstruction) Do you stop breathing, choke, or gasp during sleep? YES   P (pressure) Do you have or are you being treated for high blood pressure? YES   B (BMI) Is your body index greater than 35 kg/m? NO   A (age) Are you 15 years old or older? NO   N (neck) Do you have a neck circumference greater than 16 inches?   NO   G (gender) Are you a male? YES   TOTAL STOP/BANG YES ANSWERS 5

## 2021-09-19 ENCOUNTER — Encounter (INDEPENDENT_AMBULATORY_CARE_PROVIDER_SITE_OTHER): Payer: BC Managed Care – PPO | Admitting: Cardiology

## 2021-09-19 DIAGNOSIS — G4733 Obstructive sleep apnea (adult) (pediatric): Secondary | ICD-10-CM

## 2021-09-24 ENCOUNTER — Other Ambulatory Visit: Payer: Self-pay | Admitting: Cardiology

## 2021-09-24 ENCOUNTER — Telehealth: Payer: Self-pay | Admitting: *Deleted

## 2021-09-24 ENCOUNTER — Ambulatory Visit: Payer: BC Managed Care – PPO

## 2021-09-24 DIAGNOSIS — R0683 Snoring: Secondary | ICD-10-CM

## 2021-09-24 DIAGNOSIS — G4733 Obstructive sleep apnea (adult) (pediatric): Secondary | ICD-10-CM

## 2021-09-24 NOTE — Telephone Encounter (Signed)
Patient notified oh HST results and recommendations. All questions were answered to his satisfaction. He agrees to proceed with CPAP titration. Scheduled on April 12th. Per [employer] no PA is required. Call reference # [id number]. ?

## 2021-09-24 NOTE — Telephone Encounter (Signed)
-----   Message from Sueanne Margarita, MD sent at 09/24/2021  1:22 PM EST ----- ?Please let patient know that they have sleep apnea.  Recommend therapeutic CPAP titration for treatment of patient's sleep disordered breathing.  If unable to perform an in lab titration then initiate ResMed auto CPAP from 4 to 15cm H2O with heated humidity and mask of choice and overnight pulse ox on CPAP.    ?

## 2021-09-24 NOTE — Procedures (Signed)
? ?  Sleep Study Report ? ?Patient Information ?Study Date: 09/19/21 ?Patient Name: Lance Terry ?Patient ID: [id number] ?Birth Date: 1976/02/15 ?Age: 46 ?Gender: Male ?Referring Physician: Glori Bickers, MD ? ?TEST DESCRIPTION: Home sleep apnea testing was completed using the WatchPat, a Type 1 device, utilizing ?peripheral arterial tonometry (PAT), chest movement, actigraphy, pulse oximetry, pulse rate, body position and snore. ?AHI was calculated with apnea and hypopnea using valid sleep time as the denominator. RDI includes apneas, ?hypopneas, and RERAs. The data acquired and the scoring of sleep and all associated events were performed in ?accordance with the recommended standards and specifications as outlined in the AASM Manual for the Scoring of ?Sleep and Associated Events 2.2.0 (2015). ? ?FINDINGS: ?1. Severe Obstructive Sleep Apnea with AHI 29.5/hr. ?2. No significant Central Sleep Apnea with pAHIc 3.7/hr. ?3. Oxygen desaturations as low as 82%. ?4. Mild to moderate snoring was present. O2 sats were < 88% for 0.1 min. ?5. Total sleep time was 6 hrs and 28 min. ?6. 12.9% of total sleep time was spent in REM sleep. ?7. Normal sleep onset latency at 19 min. ?8. Normal REM sleep onset latency at 77 min. ?9. Total awakenings were 14. ? ?DIAGNOSIS: ?Severe Obstructive Sleep Apnea (G47.33) ? ?RECOMMENDATIONS: ?1. Clinical correlation of these findings is necessary. The decision to treat obstructive sleep apnea (OSA) is usually ?based on the presence of apnea symptoms or the presence of associated medical conditions such as Hypertension, ?Congestive Heart Failure, Atrial Fibrillation or Obesity. The most common symptoms of OSA are snoring, gasping for ?breath while sleeping, daytime sleepiness and fatigue. ? ?2. Initiating apnea therapy is recommended given the presence of symptoms and/or associated conditions. ?Recommend proceeding with one of the following: ? ? a. Auto-CPAP therapy with a pressure range of  5-20cm H2O. ? ? b. An oral appliance (OA) that can be obtained from certain dentists with expertise in sleep medicine. These are ?primarily of use in non-obese patients with mild and moderate disease. ? ? c. An ENT consultation which may be useful to look for specific causes of obstruction and possible treatment ?options. ? ? d. If patient is intolerant to PAP therapy, consider referral to ENT for evaluation for hypoglossal nerve stimulator. ? ?3. Close follow-up is necessary to ensure success with CPAP or oral appliance therapy for maximum benefit . ? ?4. A follow-up oximetry study on CPAP is recommended to assess the adequacy of therapy and determine the need ?for supplemental oxygen or the potential need for Bi-level therapy. An arterial blood gas to determine the adequacy of ?baseline ventilation and oxygenation should also be considered. ? ?5. Healthy sleep recommendations include: adequate nightly sleep (normal 7-9 hrs/night), avoidance of caffeine after ?noon and alcohol near bedtime, and maintaining a sleep environment that is cool, dark and quiet. ? ?6. Weight loss for overweight patients is recommended. Even modest amounts of weight loss can significantly ?improve the severity of sleep apnea. ? ?7. Snoring recommendations include: weight loss where appropriate, side sleeping, and avoidance of alcohol before ?bed. ? ?8. Operation of motor vehicle should be avoided when sleepy. ? ?Signature: ?Electronically Signed: 09/24/21 ?Fransico Him, MD; [facility]; [place], [place] Board of Sleep Medicine ? ?

## 2021-10-31 ENCOUNTER — Ambulatory Visit (HOSPITAL_BASED_OUTPATIENT_CLINIC_OR_DEPARTMENT_OTHER): Payer: BC Managed Care – PPO | Attending: Cardiology | Admitting: Cardiology

## 2021-10-31 DIAGNOSIS — G4733 Obstructive sleep apnea (adult) (pediatric): Secondary | ICD-10-CM | POA: Diagnosis not present

## 2021-10-31 DIAGNOSIS — I493 Ventricular premature depolarization: Secondary | ICD-10-CM | POA: Diagnosis not present

## 2021-11-02 NOTE — Procedures (Signed)
? ?  Patient Name: Lance Terry, Lance Terry ?Study Date: 10/31/2021 ?Gender: Male ?D.O.B: 1976/05/02 ?Age (years): 44 ?Referring Provider: Fransico Him MD, ABSM ?Height (inches): 70 ?Interpreting Physician: Fransico Him MD, ABSM ?Weight (lbs): 207 ?RPSGT: Carolin Coy ?BMI: 30 ?MRN: [id number] ?Neck Size: 16.00 ? ?CLINICAL INFORMATION ?The patient is referred for a CPAP titration to treat sleep apnea. ? ?SLEEP STUDY TECHNIQUE ?As per the AASM Manual for the Scoring of Sleep and Associated Events v2.3 (April 2016) with a hypopnea requiring 4% desaturations. ? ?The channels recorded and monitored were frontal, central and occipital EEG, electrooculogram (EOG), submentalis EMG (chin), nasal and oral airflow, thoracic and abdominal wall motion, anterior tibialis EMG, snore microphone, electrocardiogram, and pulse oximetry. Continuous positive airway pressure (CPAP) was initiated at the beginning of the study and titrated to treat sleep-disordered breathing. ? ?MEDICATIONS ?Medications self-administered by patient taken the night of the study : N/A ? ?TECHNICIAN COMMENTS ?Comments added by technician: Patient was ordered as a cpap titration ? ?Comments added by scorer: N/A ?RESPIRATORY PARAMETERS ?Optimal PAP Pressure (cm): 18  ?AHI at Optimal Pressure (/hr):0 ?Overall Minimal O2 (%):91.0  ?Supine % at Optimal Pressure (%):0 ?Minimal O2 at Optimal Pressure (%): 94.0  ? ?SLEEP ARCHITECTURE ?The study was initiated at 10:49:09 PM and ended at 4:47:18 AM. ? ?Sleep onset time was 43.4 minutes and the sleep efficiency was 73.7%. The total sleep time was 264 minutes. ? ?The patient spent 15.7% of the night in stage N1 sleep, 67.4% in stage N2 sleep, 0.4% in stage N3 and 16.5% in REM.Stage REM latency was 88.5 minutes ? ?Wake after sleep onset was 50.7. Alpha intrusion was absent. Supine sleep was 2.02%. ? ?CARDIAC DATA ?The 2 lead EKG demonstrated sinus rhythm. The mean heart rate was 62.0 beats per minute. Other EKG findings include:  PVCs. ? ?LEG MOVEMENT DATA ?The total Periodic Limb Movements of Sleep (PLMS) were 0. The PLMS index was 0.0. A PLMS index of <15 is considered normal in adults. ? ?IMPRESSIONS ?- The optimal PAP pressure was 18 cm of water. ?- Significant oxygen desaturations were not observed during this titration (min O2 = 91.0%). ?- The patient snored with moderate snoring volume during this titration study. ?- 2-lead EKG demonstrated: PVCs ?- Clinically significant periodic limb movements were not noted during this study. Arousals associated with PLMs were rare. ? ?DIAGNOSIS ?- Obstructive Sleep Apnea (G47.33) ? ?RECOMMENDATIONS ?- Trial of CPAP therapy on 18 cm H2O with a Medium size Resmed Full Face AirFit F30i mask and heated humidification. ?- Avoid alcohol, sedatives and other CNS depressants that may worsen sleep apnea and disrupt normal sleep architecture. ?- Sleep hygiene should be reviewed to assess factors that may improve sleep quality. ?- Weight management and regular exercise should be initiated or continued. ?- Return to Sleep Center for re-evaluation after 6 weeks of therapy ? ?[Electronically signed] 11/02/2021 11:20 AM ? ?Fransico Him MD, ABSM ?Diplomate, [employer] adviser of Sleep Medicine ?

## 2021-11-09 ENCOUNTER — Telehealth: Payer: Self-pay | Admitting: *Deleted

## 2021-11-09 NOTE — Telephone Encounter (Signed)
-----   Message from Lauralee Evener, [place] sent at 11/05/2021  8:31 AM EDT ----- ? ?----- Message ----- ?From: Sueanne Margarita, MD ?Sent: 11/02/2021  11:22 AM EDT ?To: [facility] Sleep Studies ? ?Please let patient know that they had a successful PAP titration and let DME know that orders are in EPIC.  Please set up 6 week OV with me.  ?  ? ?

## 2021-11-09 NOTE — Telephone Encounter (Addendum)
Left detailed message on voicemail and informed patient to call back. ? ?Return call: ?The patient has been notified of the result and verbalized understanding.  All questions (if any) were answered. ?[place], [place], [place] 11/09/2021 1:54 PM   ? ?Upon patient request DME selection is [facility] ?Patient understands he will be contacted by [facility] to set up his cpap. ?Patient understands to call if [facility] does not contact him with new setup in a timely manner. ?Patient understands they will be called once confirmation has been received from [facility]/ that they have received their new machine to schedule 10 week follow up appointment. ?  ?[facility] notified of new cpap order  ?Please add to airview ?Patient was grateful for the call and thanked me.  ? ? ? ? ?

## 2021-12-09 ENCOUNTER — Other Ambulatory Visit (HOSPITAL_COMMUNITY): Payer: Self-pay | Admitting: Internal Medicine

## 2022-01-11 ENCOUNTER — Other Ambulatory Visit (HOSPITAL_COMMUNITY): Payer: Self-pay | Admitting: Internal Medicine

## 2022-02-08 ENCOUNTER — Ambulatory Visit: Payer: BC Managed Care – PPO | Admitting: Cardiology

## 2022-02-08 ENCOUNTER — Encounter: Payer: Self-pay | Admitting: Cardiology

## 2022-02-08 VITALS — BP 140/88 | HR 76 | Ht 70.0 in | Wt 215.0 lb

## 2022-02-08 DIAGNOSIS — G4733 Obstructive sleep apnea (adult) (pediatric): Secondary | ICD-10-CM

## 2022-02-08 DIAGNOSIS — I10 Essential (primary) hypertension: Secondary | ICD-10-CM

## 2022-02-08 NOTE — Progress Notes (Signed)
Sleep Medicine CONSULT Note    Date:  02/08/2022   ID:  Lance Terry, DOB 07-31-1975, MRN [id number]  PCP:  Adin Hector, MD  Cardiologist: Glori Bickers, MD  Chief Complaint  Patient presents with   New Patient (Initial Visit)    OSA    History of Present Illness:  Lance Terry is a 46 y.o. male who is being seen today for the evaluation of OSA at the request of Glori Bickers MD.  This is a 46 year old male with a history of CAD, hyperlipidemia and hypertension who recently was seen by Dr. Haroldine Laws and complained that his wife said he snores a lot and was having periods of apnea when he was sleeping.  He underwent a home sleep study which showed severe obstructive sleep apnea with an AHI of 29.5/h and no significant central sleep apnea with a pH AIC of 3.7/h.  Lowest O2 saturation was 82%.  He underwent CPAP titration was tolerated titrated to 18 cm H2O.  He is now here for sleep evaluation. He tells me that prior to CPAP he was sleeping poorly and felt sleepy during the day.   He is doing well with his CPAP device and thinks that he has gotten used to it.  He tolerates the mask except for when it is moving around in his sleep. He says that the pressure feels to high at times.   Since going on CPAP he feels rested in the am and has no significant daytime sleepiness.  He has a lot of mouth dryness.  He denies any significant nasal dryness or nasal congestion.  He does not think that he snores.     Past Medical History:  Diagnosis Date   Coronary artery disease    -small NSTEMI in setting of heat stroke 7/10 -Cath 50-60% LAD; 7/10 -ET.[name]: EF 71% no ischemia 7/10    Hyperlipidemia    mixed   Hypertension     Past Surgical History:  Procedure Laterality Date   CORONARY STENT INTERVENTION N/A 11/13/2020   Procedure: CORONARY STENT INTERVENTION;  Surgeon: Sherren Mocha, MD;  Location: [facility];  Service: Cardiovascular;  Laterality: N/A;   INTRAVASCULAR  ULTRASOUND/IVUS N/A 11/13/2020   Procedure: Intravascular Ultrasound/IVUS;  Surgeon: Sherren Mocha, MD;  Location: [facility];  Service: Cardiovascular;  Laterality: N/A;   LEFT HEART CATH AND CORONARY ANGIOGRAPHY N/A 11/13/2020   Procedure: LEFT HEART CATH AND CORONARY ANGIOGRAPHY;  Surgeon: Jolaine Artist, MD;  Location: [facility];  Service: Cardiovascular;  Laterality: N/A;   SHOULDER SURGERY  2003    Current Medications: Current Meds  Medication Sig   aspirin 81 MG tablet Take 81 mg by mouth daily.   clopidogrel (PLAVIX) 75 MG tablet Take 1 tablet (75 mg total) by mouth daily. NEEDS FOLLOW UP APPOINTMENT FOR ANYMORE REFILLS   ezetimibe (ZETIA) 10 MG tablet TAKE 1 TABLET BY MOUTH EVERY DAY   icosapent Ethyl (VASCEPA) 1 g capsule TAKE 2 CAPSULES BY MOUTH TWICE A DAY   lisinopril (ZESTRIL) 20 MG tablet TAKE 1 TABLET (20 MG TOTAL) BY MOUTH DAILY.   metoprolol succinate (TOPROL-XL) 25 MG 24 hr tablet Take 1 tablet (25 mg total) by mouth daily.   Multiple Vitamins-Minerals (MULTIVITAL) tablet Take 1 tablet by mouth daily.   nitroGLYCERIN (NITROSTAT) 0.4 MG SL tablet Place 1 tablet (0.4 mg total) under the tongue every 5 (five) minutes as needed for chest pain.   rosuvastatin (CRESTOR) 40  MG tablet Take 1 tablet (40 mg total) by mouth daily.    Allergies:   Penicillins   Social History   Socioeconomic History   Marital status: Married    Spouse name: Not on file   Number of children: Not on file   Years of education: Not on file   Highest education level: Not on file  Occupational History   Not on file  Tobacco Use   Smoking status: Former   Smokeless tobacco: Never  Vaping Use   Vaping Use: Never used  Substance and Sexual Activity   Alcohol use: Yes    Comment: Occasional    Drug use: No   Sexual activity: Not on file  Other Topics Concern   Not on file  Social History Narrative   Regularly exercises. Full time.    Social Determinants of Health    Financial Resource Strain: Not on file  Food Insecurity: Not on file  Transportation Needs: Not on file  Physical Activity: Not on file  Stress: Not on file  Social Connections: Not on file     Family History:  The patient's family history includes Coronary artery disease in his father, paternal grandfather, and another family member; Heart attack in his paternal grandfather; Heart attack (age of onset: 43) in his father.   ROS:   Please see the history of present illness.    ROS All other systems reviewed and are negative.     11/27/2017    2:08 PM  PAD Screen  Previous PAD dx? No  Previous surgical procedure? No  Pain with walking? No  Feet/toe relief with dangling? No  Painful, non-healing ulcers? No  Extremities discolored? No       PHYSICAL EXAM:   VS:  BP 140/88   Pulse 76   Ht 5\' 10"  (1.778 m)   Wt 215 lb (97.5 kg)   BMI 30.85 kg/m    GEN: Well nourished, well developed, in no acute distress  HEENT: normal  Neck: no JVD, carotid bruits, or masses Cardiac: RRR; no murmurs, rubs, or gallops,no edema.  Intact distal pulses bilaterally.  Respiratory:  clear to auscultation bilaterally, normal work of breathing GI: soft, nontender, nondistended, + BS MS: no deformity or atrophy  Skin: warm and dry, no rash Neuro:  Alert and Oriented x 3, Strength and sensation are intact Psych: euthymic mood, full affect  Wt Readings from Last 3 Encounters:  02/08/22 215 lb (97.5 kg)  10/31/21 207 lb (93.9 kg)  06/07/21 207 lb 3.2 oz (94 kg)      Studies/Labs Reviewed:   Home sleep study, CPAP titration, PAP compliance download  Recent Labs: No results found for requested labs within last 365 days.   Lipid Panel    Component Value Date/Time   CHOL 96 11/14/2020 0243   CHOL 143 05/02/2020 0814   TRIG 217 (H) 11/14/2020 0243   HDL 30 (L) 11/14/2020 0243   HDL 42 05/02/2020 0814   CHOLHDL 3.2 11/14/2020 0243   VLDL 43 (H) 11/14/2020 0243   LDLCALC 23 11/14/2020  0243   LDLCALC 77 05/02/2020 0814     Additional studies/ records that were reviewed today include:  Office visit notes from Dr. 07/02/2020    ASSESSMENT:    1. OSA (obstructive sleep apnea)   2. Essential hypertension      PLAN:  In order of problems listed above:  OSA - The patient is tolerating PAP therapy well without any problems. The PAP download performed  by his DME was personally reviewed and interpreted by me today and showed an AHI of 1.7 /hr on 18 cm H2O with 80% compliance in using more than 4 hours nightly.  The patient has been using and benefiting from PAP use and will continue to benefit from therapy.  -I will change to auto CPAP from 4 to 20cm H2O since he feels at times the pressure is too high. -Check download in 4 weeks  Hypertension -BP is well controlled on exam today -Continue prescription drug management lisinopril 20 mg daily, Toprol-XL 25 mg daily with as needed refills  Time Spent: 20 minutes total time of encounter, including 15 minutes spent in face-to-face patient care on the date of this encounter. This time includes coordination of care and counseling regarding above mentioned problem list. Remainder of non-face-to-face time involved reviewing chart documents/testing relevant to the patient encounter and documentation in the medical record. I have independently reviewed documentation from referring provider  Medication Adjustments/Labs and Tests Ordered: Current medicines are reviewed at length with the patient today.  Concerns regarding medicines are outlined above.  Medication changes, Labs and Tests ordered today are listed in the Patient Instructions below.  There are no Patient Instructions on file for this visit.   Signed, Armanda Magic, MD [facility], Diplomat ABSM 02/08/2022 8:48 AM    [facility] [address] [place], [place], [place]  [address] Phone: [phone]; Fax: [phone]

## 2022-02-08 NOTE — Patient Instructions (Signed)

## 2022-02-12 ENCOUNTER — Telehealth: Payer: Self-pay | Admitting: *Deleted

## 2022-02-12 DIAGNOSIS — G4733 Obstructive sleep apnea (adult) (pediatric): Secondary | ICD-10-CM

## 2022-02-12 NOTE — Telephone Encounter (Signed)
Order placed to [facility] via community message.

## 2022-02-12 NOTE — Telephone Encounter (Signed)
-----   Message from Theresia Majors, [place] sent at 02/08/2022  8:54 AM EDT ----- Per Dr. Mayford Knife: change to auto CPAP from 4 to 20cm H2O since he feels at times the pressure is too high. -Check download in 4 weeks Thanks!

## 2022-04-10 ENCOUNTER — Other Ambulatory Visit (HOSPITAL_COMMUNITY): Payer: Self-pay | Admitting: Internal Medicine

## 2022-04-25 ENCOUNTER — Other Ambulatory Visit (HOSPITAL_COMMUNITY): Payer: Self-pay | Admitting: Internal Medicine

## 2022-04-30 ENCOUNTER — Encounter: Payer: Self-pay | Admitting: Podiatry

## 2022-04-30 ENCOUNTER — Ambulatory Visit: Payer: BC Managed Care – PPO | Admitting: Podiatry

## 2022-04-30 ENCOUNTER — Ambulatory Visit (INDEPENDENT_AMBULATORY_CARE_PROVIDER_SITE_OTHER): Payer: BC Managed Care – PPO

## 2022-04-30 DIAGNOSIS — M7741 Metatarsalgia, right foot: Secondary | ICD-10-CM | POA: Diagnosis not present

## 2022-04-30 DIAGNOSIS — M722 Plantar fascial fibromatosis: Secondary | ICD-10-CM | POA: Diagnosis not present

## 2022-04-30 NOTE — Progress Notes (Signed)
Subjective:  Patient ID: Lance Terry, male    DOB: 06/24/1976,  MRN: [id number]  Chief Complaint  Patient presents with   Foot Pain    "I have pain across the ball of my foot." N - ball of foot pain L - 1-5 metatarsal rt D - 2 mos O - suddenly C - dull ache, sore A - walking T - none    46 y.o. male presents with the above complaint.  Patient presents with right forefoot metatarsalgia with a possible neuroma in the third interspace.  He states been on for 2 months has progressive gotten worse.  He denied is any other acute complaints dull achy in nature is suddenly pain scale is 3 out of 10   Review of Systems: Negative except as noted in the HPI. Denies N/V/F/Ch.  Past Medical History:  Diagnosis Date   Coronary artery disease    -small NSTEMI in setting of heat stroke 7/10 -Cath 50-60% LAD; 7/10 -ET.Celine Ahr: EF 71% no ischemia 7/10    Hyperlipidemia    mixed   Hypertension     Current Outpatient Medications:    aspirin 81 MG tablet, Take 81 mg by mouth daily., Disp: , Rfl:    clopidogrel (PLAVIX) 75 MG tablet, TAKE 1 TABLET (75 MG TOTAL) BY MOUTH DAILY. NEEDS FOLLOW UP APPOINTMENT FOR ANYMORE REFILLS, Disp: 90 tablet, Rfl: 0   ezetimibe (ZETIA) 10 MG tablet, TAKE 1 TABLET BY MOUTH EVERY DAY, Disp: 90 tablet, Rfl: 3   icosapent Ethyl (VASCEPA) 1 g capsule, TAKE 2 CAPSULES BY MOUTH TWICE A DAY, Disp: 360 capsule, Rfl: 2   lisinopril (ZESTRIL) 20 MG tablet, TAKE 1 TABLET (20 MG TOTAL) BY MOUTH DAILY., Disp: 90 tablet, Rfl: 3   metoprolol succinate (TOPROL-XL) 25 MG 24 hr tablet, Take 1 tablet (25 mg total) by mouth daily., Disp: 90 tablet, Rfl: 3   Multiple Vitamins-Minerals (MULTIVITAL) tablet, Take 1 tablet by mouth daily., Disp: , Rfl:    nitroGLYCERIN (NITROSTAT) 0.4 MG SL tablet, Place 1 tablet (0.4 mg total) under the tongue every 5 (five) minutes as needed for chest pain., Disp: 25 tablet, Rfl: 3   rosuvastatin (CRESTOR) 40 MG tablet, Take 1 tablet (40 mg total) by  mouth daily. NEEDS FOLLOW UP APPOINTMENT FOR ANYMORE REFILLS, Disp: 90 tablet, Rfl: 0  Social History   Tobacco Use  Smoking Status Former  Smokeless Tobacco Never    Allergies  Allergen Reactions   Penicillins Rash   Objective:  There were no vitals filed for this visit. There is no height or weight on file to calculate BMI. Constitutional Well developed. Well nourished.  Vascular Dorsalis pedis pulses palpable bilaterally. Posterior tibial pulses palpable bilaterally. Capillary refill normal to all digits.  No cyanosis or clubbing noted. Pedal hair growth normal.  Neurologic Normal speech. Oriented to person, place, and time. Epicritic sensation to light touch grossly present bilaterally.  Dermatologic Nails well groomed and normal in appearance. No open wounds. No skin lesions.  Orthopedic: Pain on palpation to the right third interspace positive Mulder click consistent with a neuroma.  Some achiness at the second and third metatarsal phalangeal joint.  Some pain with range of motion.  Pain across the ball of the foot consistent with metatarsalgia   Radiographs: 3 views of skeletally mature right foot: No metatarsal fractures noted no stress fracture noted.  No bony abnormalities identified Assessment:   1. Metatarsalgia, right foot    Plan:  Patient was evaluated and treated and  all questions answered.  Right forefoot metatarsalgia versus third interspace neuroma -All questions and concerns were discussed with the patient in extensive detail.  Due to compensation patient is hurting in the entire forefoot I believe patient will benefit from cam boot immobilization to allow the soft tissue structure to heal appropriately when I see him back again if he still has any residual pain we will discuss steroid injection and orthotics during next time.  Patient states understanding  No follow-ups on file.

## 2022-05-26 IMAGING — CT CT HEART MORP W/ CTA COR W/ SCORE W/ CA W/CM &/OR W/O CM
2 of 13 series · 5 of 20 positions shown, 6 images · non-contrast
Comparison: None.

Addendum:
CLINICAL DATA: Chest pain

EXAM:
Cardiac/Coronary  CTA
TECHNIQUE: The patient was scanned on a Siemens Somatoform go.Top scanner.

[Series 28: multiphase % cta coronary 0.60 · axial · 0.43mm/px · z∈[-1113,-1054]mm · 3 of 2910 slices shown, 4 images]
[im 728/2910  vessel]
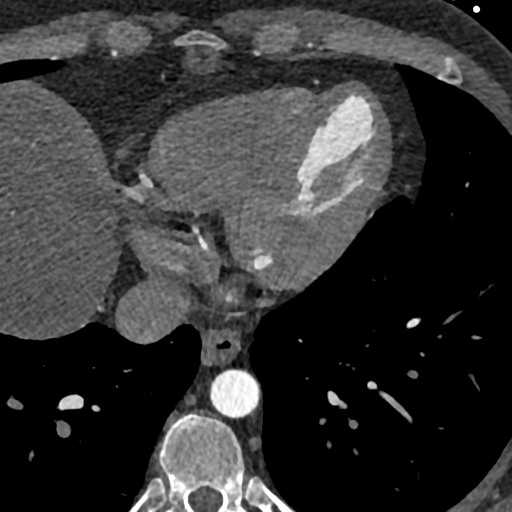
[im 728/2910  lung]
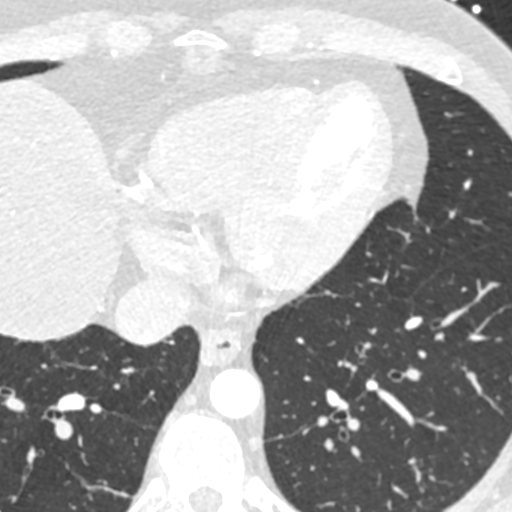
[im 1455/2910  vessel]
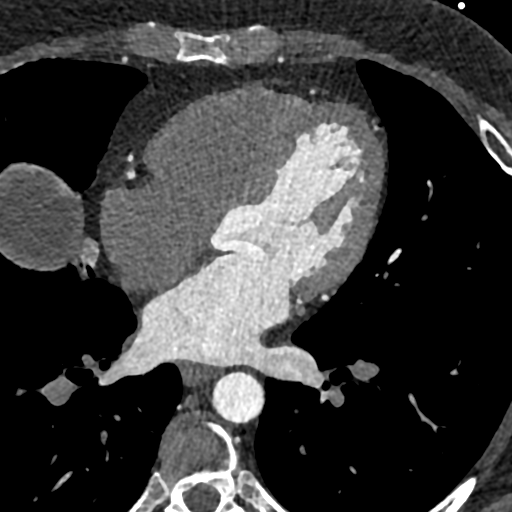
[im 2182/2910  vessel]
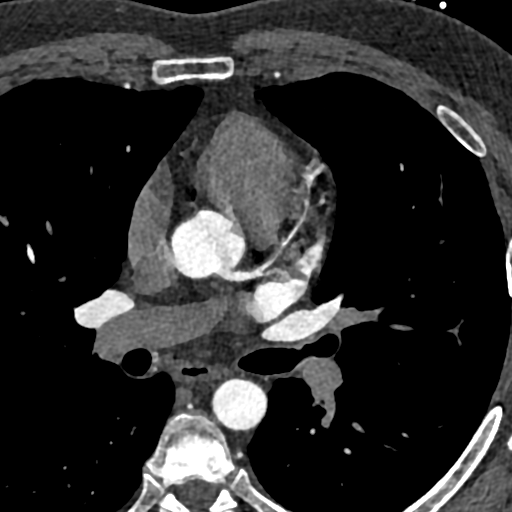

[Series 46: ms multiphase cta coronary 0.60 · axial · 0.43mm/px · z∈[-1103,-1064]mm · 2 of 2328 slices shown]
[im 776/2328  vessel]
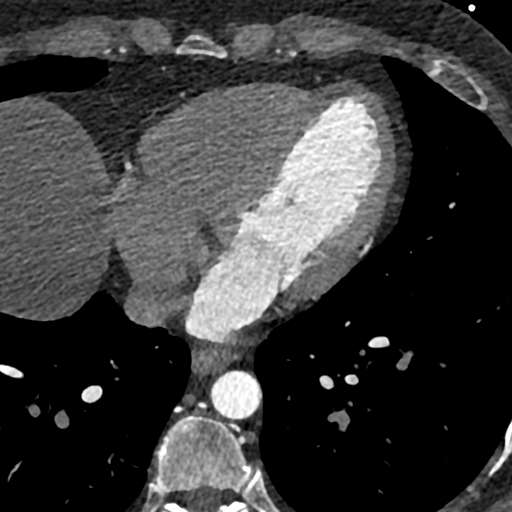
[im 1552/2328  vessel]
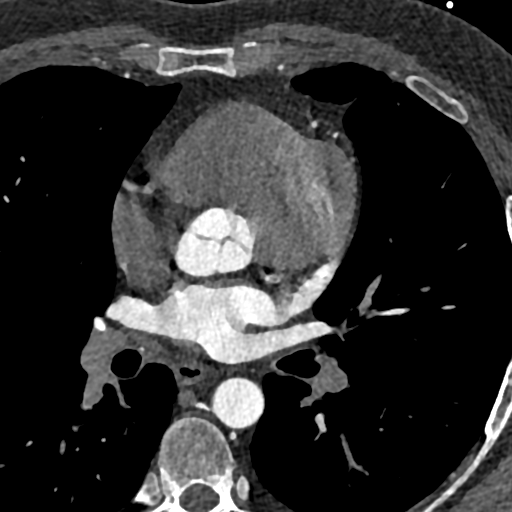

[5 of 20 positions shown; findings below may reference images not displayed]

FINDINGS: A retrospective scan was triggered in the descending thoracic aorta.
Axial non-contrast 3 mm slices were carried out through the heart.
The data set was analyzed on a dedicated work station and scored
using the Agatson method. Gantry rotation speed was 330 msecs and
collimation was .6 mm. 100mg of metoprolol and 0.8 mg of sl NTG was
given. The 3D data set was reconstructed in 5% intervals of the
60-95 % of the R-R cycle. Diastolic phases were analyzed on a
dedicated work station using MPR, MIP and VRT modes. The patient
received 75 cc of contrast.

Aorta:  Normal size.  No calcifications.  No dissection.

Aortic Valve:  Trileaflet.  No calcifications.

Coronary Arteries:  Normal coronary origin.  Right dominance.

RCA is a dominant artery that gives rise to PDA and PLA. There is
minimal calcified plaque in the distal segment causing minimal
stenosis (<25%).

Left main is a large artery that gives rise to LAD and LCX arteries.

LAD has minimal calcifications in the mid segment causing minimal
stenosis. There is non calcified plaque in the mid to distal segment
causing severe stenosis (>70%).

LCX is a non-dominant artery that gives rise to one OM1 branch.
There is mild calcified plaque in the mid LCx causing minimal
stenosis (<25%)

Other findings:

Normal pulmonary vein drainage into the left atrium.

Normal left atrial appendage without a thrombus.

Normal size of the pulmonary artery.
IMPRESSION: 1. Coronary calcium score of 150. This was 97th percentile for age
and sex matched control.

2. Normal coronary origin with right dominance.

3. Non calcified plaque causing severe stenosis in the mid-distal
LAD

4. CAD-RADS 4 Severe stenosis. (70-99%). Cardiac catheterization or
CT FFR is recommended. Consider symptom-guided anti-ischemic
pharmacotherapy as well as risk factor modification per guideline
directed care.

Additional analysis with CT FFR will be submitted and reported
separately.

EXAM:
OVER-READ INTERPRETATION  CT CHEST

The following report is an over-read performed by radiologist Dr.
over-read does not include interpretation of cardiac or coronary
anatomy or pathology. The coronary calcium score and cardiac CTA
interpretation by the cardiologist is attached.
FINDINGS: There appears to be complete collapse of the right middle lobe where
there are areas of cylindrical bronchiectasis, likely secondary to
remote infection and chronic post infectious scarring. Parenchymal
calcifications are noted in the collapsed right middle lobe.
Visualized portions of the lungs are otherwise clear.Within the
visualized portions of the thorax there are no suspicious appearing
pulmonary nodules or masses, there is no acute consolidative
airspace disease, no pleural effusions, no pneumothorax and no
lymphadenopathy. Visualized portions of the upper abdomen
demonstrates severe diffuse low attenuation throughout the
visualized hepatic parenchyma, indicative of severe hepatic
steatosis. There are no aggressive appearing lytic or blastic
lesions noted in the visualized portions of the skeleton.
IMPRESSION: 1. Severe hepatic steatosis.
2. Chronic post infectious scarring and volume loss in the right
middle lobe, as above.

*** End of Addendum ***
FINDINGS: A retrospective scan was triggered in the descending thoracic aorta.
Axial non-contrast 3 mm slices were carried out through the heart.
The data set was analyzed on a dedicated work station and scored
using the Agatson method. Gantry rotation speed was 330 msecs and
collimation was .6 mm. 100mg of metoprolol and 0.8 mg of sl NTG was
given. The 3D data set was reconstructed in 5% intervals of the
60-95 % of the R-R cycle. Diastolic phases were analyzed on a
dedicated work station using MPR, MIP and VRT modes. The patient
received 75 cc of contrast.

Aorta:  Normal size.  No calcifications.  No dissection.

Aortic Valve:  Trileaflet.  No calcifications.

Coronary Arteries:  Normal coronary origin.  Right dominance.

RCA is a dominant artery that gives rise to PDA and PLA. There is
minimal calcified plaque in the distal segment causing minimal
stenosis (<25%).

Left main is a large artery that gives rise to LAD and LCX arteries.

LAD has minimal calcifications in the mid segment causing minimal
stenosis. There is non calcified plaque in the mid to distal segment
causing severe stenosis (>70%).

LCX is a non-dominant artery that gives rise to one OM1 branch.
There is mild calcified plaque in the mid LCx causing minimal
stenosis (<25%)

Other findings:

Normal pulmonary vein drainage into the left atrium.

Normal left atrial appendage without a thrombus.

Normal size of the pulmonary artery.
IMPRESSION: 1. Coronary calcium score of 150. This was 97th percentile for age
and sex matched control.

2. Normal coronary origin with right dominance.

3. Non calcified plaque causing severe stenosis in the mid-distal
LAD

4. CAD-RADS 4 Severe stenosis. (70-99%). Cardiac catheterization or
CT FFR is recommended. Consider symptom-guided anti-ischemic
pharmacotherapy as well as risk factor modification per guideline
directed care.

Additional analysis with CT FFR will be submitted and reported
separately.

## 2022-05-28 ENCOUNTER — Ambulatory Visit: Payer: BC Managed Care – PPO | Admitting: Podiatry

## 2022-05-28 DIAGNOSIS — M7741 Metatarsalgia, right foot: Secondary | ICD-10-CM | POA: Diagnosis not present

## 2022-05-28 DIAGNOSIS — Q666 Other congenital valgus deformities of feet: Secondary | ICD-10-CM

## 2022-05-30 NOTE — Progress Notes (Signed)
Subjective:  Patient ID: Lance Terry, male    DOB: 01/01/1976,  MRN: [id number]  Chief Complaint  Patient presents with   Foot Pain    Pt stated that his foot is no better no worse things are about the same. He stated that the boot is not much help with the pain     46 y.o. male presents with the above complaint.  Patient presents with right forefoot metatarsalgia.  He states the pain is about the same no better no worse.  The boot did not help much with the pain.  He would like to discuss next treatment plan he does not want injection   Review of Systems: Negative except as noted in the HPI. Denies N/V/F/Ch.  Past Medical History:  Diagnosis Date   Coronary artery disease    -small NSTEMI in setting of heat stroke 7/10 -Cath 50-60% LAD; 7/10 -ET.Celine Ahr: EF 71% no ischemia 7/10    Hyperlipidemia    mixed   Hypertension     Current Outpatient Medications:    aspirin 81 MG tablet, Take 81 mg by mouth daily., Disp: , Rfl:    clopidogrel (PLAVIX) 75 MG tablet, TAKE 1 TABLET (75 MG TOTAL) BY MOUTH DAILY. NEEDS FOLLOW UP APPOINTMENT FOR ANYMORE REFILLS, Disp: 90 tablet, Rfl: 0   ezetimibe (ZETIA) 10 MG tablet, TAKE 1 TABLET BY MOUTH EVERY DAY, Disp: 90 tablet, Rfl: 3   icosapent Ethyl (VASCEPA) 1 g capsule, TAKE 2 CAPSULES BY MOUTH TWICE A DAY, Disp: 360 capsule, Rfl: 2   lisinopril (ZESTRIL) 20 MG tablet, TAKE 1 TABLET (20 MG TOTAL) BY MOUTH DAILY., Disp: 90 tablet, Rfl: 3   metoprolol succinate (TOPROL-XL) 25 MG 24 hr tablet, Take 1 tablet (25 mg total) by mouth daily., Disp: 90 tablet, Rfl: 3   Multiple Vitamins-Minerals (MULTIVITAL) tablet, Take 1 tablet by mouth daily., Disp: , Rfl:    nitroGLYCERIN (NITROSTAT) 0.4 MG SL tablet, Place 1 tablet (0.4 mg total) under the tongue every 5 (five) minutes as needed for chest pain., Disp: 25 tablet, Rfl: 3   rosuvastatin (CRESTOR) 40 MG tablet, Take 1 tablet (40 mg total) by mouth daily. NEEDS FOLLOW UP APPOINTMENT FOR ANYMORE REFILLS,  Disp: 90 tablet, Rfl: 0  Social History   Tobacco Use  Smoking Status Former  Smokeless Tobacco Never    Allergies  Allergen Reactions   Penicillins Rash   Objective:  There were no vitals filed for this visit. There is no height or weight on file to calculate BMI. Constitutional Well developed. Well nourished.  Vascular Dorsalis pedis pulses palpable bilaterally. Posterior tibial pulses palpable bilaterally. Capillary refill normal to all digits.  No cyanosis or clubbing noted. Pedal hair growth normal.  Neurologic Normal speech. Oriented to person, place, and time. Epicritic sensation to light touch grossly present bilaterally.  Dermatologic Nails well groomed and normal in appearance. No open wounds. No skin lesions.  Orthopedic: Pain on palpation to the right third interspace positive Mulder click consistent with a neuroma.  Some achiness at the second and third metatarsal phalangeal joint.  Some pain with range of motion.  Pain across the ball of the foot consistent with metatarsalgia   Radiographs: 3 views of skeletally mature right foot: No metatarsal fractures noted no stress fracture noted.  No bony abnormalities identified Assessment:   No diagnosis found.  Plan:  Patient was evaluated and treated and all questions answered.  Right forefoot metatarsalgia versus third interspace neuroma -All questions and concerns were discussed  with the patient in extensive detail.   -Patient can transition out of cam boot into regular shoes.  At this time patient will benefit from orthotics as well to offload the forefoot.  Pain is clinically about the same.  I will hold off on any further injections for now as patient is now undergo injection.   Pes planovalgus -I explained to patient the etiology of pes planovalgus and relationship with forefoot metatarsalgia and various treatment options were discussed.  Given patient foot structure in the setting of forefoot metatarsalgia I  believe patient will benefit from custom-made orthotics to help control the hindfoot motion support the arch of the foot and take the stress away from plantar fascial.  Patient agrees with the plan like to proceed with orthotics -Patient was casted for orthotics  No follow-ups on file.

## 2022-06-05 ENCOUNTER — Other Ambulatory Visit (HOSPITAL_COMMUNITY): Payer: Self-pay | Admitting: Internal Medicine

## 2022-06-28 ENCOUNTER — Ambulatory Visit (INDEPENDENT_AMBULATORY_CARE_PROVIDER_SITE_OTHER): Payer: BC Managed Care – PPO | Admitting: Podiatry

## 2022-06-28 VITALS — BP 135/82 | HR 71

## 2022-06-28 DIAGNOSIS — M7741 Metatarsalgia, right foot: Secondary | ICD-10-CM

## 2022-06-28 DIAGNOSIS — Q666 Other congenital valgus deformities of feet: Secondary | ICD-10-CM

## 2022-06-28 NOTE — Patient Instructions (Signed)

## 2022-06-28 NOTE — Progress Notes (Signed)
Patient presents to pick up orthotics. The orthotics fit well. Wearing instructions were given. He was advised to return as needed.  

## 2022-07-05 ENCOUNTER — Other Ambulatory Visit (HOSPITAL_COMMUNITY): Payer: Self-pay | Admitting: Internal Medicine

## 2022-07-24 ENCOUNTER — Other Ambulatory Visit (HOSPITAL_COMMUNITY): Payer: Self-pay | Admitting: Internal Medicine

## 2022-08-02 ENCOUNTER — Other Ambulatory Visit (HOSPITAL_COMMUNITY): Payer: Self-pay | Admitting: Internal Medicine

## 2022-08-06 ENCOUNTER — Other Ambulatory Visit (HOSPITAL_COMMUNITY): Payer: Self-pay | Admitting: Internal Medicine

## 2022-08-21 ENCOUNTER — Other Ambulatory Visit (HOSPITAL_COMMUNITY): Payer: Self-pay | Admitting: Internal Medicine

## 2022-08-24 ENCOUNTER — Other Ambulatory Visit (HOSPITAL_COMMUNITY): Payer: Self-pay | Admitting: Internal Medicine

## 2022-08-28 ENCOUNTER — Other Ambulatory Visit (HOSPITAL_COMMUNITY): Payer: Self-pay | Admitting: Internal Medicine

## 2022-09-01 ENCOUNTER — Other Ambulatory Visit (HOSPITAL_COMMUNITY): Payer: Self-pay | Admitting: Internal Medicine

## 2022-09-17 ENCOUNTER — Other Ambulatory Visit (HOSPITAL_COMMUNITY): Payer: Self-pay | Admitting: Internal Medicine

## 2022-11-21 ENCOUNTER — Other Ambulatory Visit (HOSPITAL_COMMUNITY): Payer: Self-pay | Admitting: Internal Medicine

## 2023-03-24 ENCOUNTER — Other Ambulatory Visit (HOSPITAL_COMMUNITY): Payer: Self-pay | Admitting: Internal Medicine

## 2023-04-07 LAB — EXTERNAL GENERIC LAB PROCEDURE: COLOGUARD: NEGATIVE

## 2023-04-08 ENCOUNTER — Other Ambulatory Visit (HOSPITAL_COMMUNITY): Payer: Self-pay | Admitting: Internal Medicine

## 2023-05-19 ENCOUNTER — Encounter (HOSPITAL_COMMUNITY): Payer: BC Managed Care – PPO | Admitting: Internal Medicine

## 2023-05-21 ENCOUNTER — Encounter (HOSPITAL_COMMUNITY): Payer: Self-pay | Admitting: Internal Medicine

## 2023-05-30 ENCOUNTER — Encounter: Payer: Self-pay | Admitting: Internal Medicine

## 2023-05-30 ENCOUNTER — Ambulatory Visit: Payer: Managed Care, Other (non HMO) | Attending: Internal Medicine | Admitting: Internal Medicine

## 2023-05-30 VITALS — BP 135/95 | HR 87 | Ht 70.0 in | Wt 219.0 lb

## 2023-05-30 DIAGNOSIS — E669 Obesity, unspecified: Secondary | ICD-10-CM

## 2023-05-30 DIAGNOSIS — Z79899 Other long term (current) drug therapy: Secondary | ICD-10-CM | POA: Insufficient documentation

## 2023-05-30 DIAGNOSIS — I10 Essential (primary) hypertension: Secondary | ICD-10-CM | POA: Insufficient documentation

## 2023-05-30 DIAGNOSIS — I252 Old myocardial infarction: Secondary | ICD-10-CM | POA: Diagnosis not present

## 2023-05-30 DIAGNOSIS — I2510 Atherosclerotic heart disease of native coronary artery without angina pectoris: Secondary | ICD-10-CM | POA: Diagnosis not present

## 2023-05-30 DIAGNOSIS — Z7902 Long term (current) use of antithrombotics/antiplatelets: Secondary | ICD-10-CM | POA: Insufficient documentation

## 2023-05-30 DIAGNOSIS — G4733 Obstructive sleep apnea (adult) (pediatric): Secondary | ICD-10-CM | POA: Insufficient documentation

## 2023-05-30 DIAGNOSIS — E782 Mixed hyperlipidemia: Secondary | ICD-10-CM | POA: Diagnosis not present

## 2023-05-30 NOTE — Progress Notes (Signed)
CARDIOLOGY CLINIC VISIT  PCP: Lynnea Ferrier, MD   HPI:  Lance Terry is a 47 y/o male with h/o HTN, HL, post-infectious collapse of RML, premature CAD with NSTEMI 7/10  in setting of heat stroke. Cath from 7/10 mid LAD lesion  50-60%.   Echo 1/22 EF 55-60%   Underwent cath 11/13/20 for + CT scan  1. LM ok 2. LAD 99% mid LAD lesion at bifurcation of large diagonal. -> PCI with DES by Dr. Excell Seltzer 3. LCk ok 4. RCA small PDA occluded at ostium with L to R collaterals  Here for 2 year f/u. Remains active with tennis and golf. Has gained some weight. No CP or SOB.   Echo 11/22 EF 60-65% RV normal.    Lipids 05/07/23 TC 126  TG 245 LDL 39 HDL 38  ROS: All systems negative except as listed in HPI, PMH and Problem List.  Past Medical History:  Diagnosis Date   Coronary artery disease    -small NSTEMI in setting of heat stroke 7/10 -Cath 50-60% LAD; 7/10 -ET.Celine Ahr: EF 71% no ischemia 7/10    Hyperlipidemia    mixed   Hypertension     Current Outpatient Medications  Medication Sig Dispense Refill   aspirin 81 MG tablet Take 81 mg by mouth daily.     clopidogrel (PLAVIX) 75 MG tablet TAKE 1 TABLET (75 MG TOTAL) BY MOUTH DAILY. NEEDS FOLLOW UP APPOINTMENT FOR ANYMORE REFILLS 90 tablet 0   ezetimibe (ZETIA) 10 MG tablet TAKE 1 TABLET BY MOUTH EVERY DAY 90 tablet 3   icosapent Ethyl (VASCEPA) 1 g capsule TAKE 2 CAPSULES BY MOUTH TWICE A DAY 360 capsule 2   lisinopril (ZESTRIL) 20 MG tablet TAKE 1 TABLET BY MOUTH EVERY DAY 90 tablet 0   metoprolol succinate (TOPROL-XL) 25 MG 24 hr tablet TAKE 1 TABLET (25 MG TOTAL) BY MOUTH DAILY. 90 tablet 0   Multiple Vitamins-Minerals (MULTIVITAL) tablet Take 1 tablet by mouth daily.     rosuvastatin (CRESTOR) 40 MG tablet TAKE 1 TABLET BY MOUTH DAILY. NEEDS FOLLOW UP APPOINTMENT FOR ANYMORE REFILLS (90 DAY/INS) 30 tablet 0   nitroGLYCERIN (NITROSTAT) 0.4 MG SL tablet Place 1 tablet (0.4 mg total) under the tongue every 5 (five) minutes as  needed for chest pain. 25 tablet 3   No current facility-administered medications for this visit.     PHYSICAL EXAM: Vitals:   05/30/23 0947  BP: (!) 135/95  Pulse: 87  SpO2: 99%  Weight: 219 lb (99.3 kg)  Height: 5\' 10"  (1.778 m)   General:  Well appearing. No resp difficulty HEENT: normal Neck: supple. no JVD. Carotids 2+ bilat; no bruits. No lymphadenopathy or thryomegaly appreciated. Cor: PMI nondisplaced. Regular rate & rhythm. No rubs, gallops or murmurs. Lungs: clear Abdomen: soft, nontender, nondistended. No hepatosplenomegaly. No bruits or masses. Good bowel sounds. Extremities: no cyanosis, clubbing, rash, edema Neuro: alert & orientedx3, cranial nerves grossly intact. moves all 4 extremities w/o difficulty. Affect pleasant   ECG: NSR 88 No ST-T wave abnormalities. Personally reviewed    ASSESSMENT & PLAN:  1. CAD  - Cath 4/22 with 99% mLAD -> s/p PCI DES - continue current regimen can drop Plavix  - No s/s angina - Keep LDL < 70 - echo 11/22 EF 60-65% Personally reviewed  2. HTN - Blood pressure mildly elevated. Check home BPs. If SBP persistently > 140. Let me know and we will increase lisinopril to 40   3. HL - Followed by  Dr. Darrick Penna - Last LDL at goal   4. Severe OSA - compliant with CPAP  5.Morbid obesity - discussed GLP1RA  Emmie Frakes,MD 10:25 AM

## 2023-05-30 NOTE — Patient Instructions (Addendum)
*  Please STOP taking Plavix.*  Special Instructions // Education:  Do the following things EVERYDAY: Weigh yourself in the morning before breakfast. Write it down and keep it in a log. Take your medicines as prescribed Eat low salt foods--Limit salt (sodium) to 2000 mg per day.  Stay as active as you can everyday Limit all fluids for the day to less than 2 liters   Follow-Up in: Your follow up appointment will be in a year. Please call if you have any questions as needed.    If you have any questions or concerns before your next appointment please send [place] a message through [place] or call our office at [phone] Monday-Friday 8 am-5 pm.   If you have an urgent need after hours on the weekend please call your Primary Cardiologist or the [facility] in [place] at [phone].   At the [facility], you and your health needs are our priority. We have a designated team specialized in the treatment of Heart Failure. This Care Team includes your primary Heart Failure Specialized Cardiologist (physician), Advanced Practice Providers (APPs- Physician Assistants and Nurse Practitioners), and Pharmacist who all work together to provide you with the care you need, when you need it.   You may see any of the following providers on your designated Care Team at your next follow up:  Dr. Arvilla Meres Dr. Marca Ancona Dr. Dorthula Nettles Dr. Theresia Bough Tonye Becket, NP Robbie Lis, [place] [address] [place], [place] Brynda Peon, NP [place] Lee, NP Clarisa Kindred, NP Enos Fling, PharmD

## 2023-09-01 ENCOUNTER — Telehealth (HOSPITAL_COMMUNITY): Payer: Self-pay | Admitting: Cardiology

## 2023-09-01 DIAGNOSIS — I25118 Atherosclerotic heart disease of native coronary artery with other forms of angina pectoris: Secondary | ICD-10-CM

## 2023-09-01 MED ORDER — ICOSAPENT ETHYL 1 G PO CAPS: 2.0000 g | ORAL_CAPSULE | Freq: Two times a day (BID) | ORAL | 2 refills | Status: AC

## 2023-09-01 MED ORDER — LISINOPRIL 20 MG PO TABS: 20.0000 mg | ORAL_TABLET | Freq: Every day | ORAL | 3 refills | Status: AC

## 2023-09-01 MED ORDER — NITROGLYCERIN 0.4 MG SL SUBL
0.4000 mg | SUBLINGUAL_TABLET | SUBLINGUAL | 3 refills | Status: AC | PRN
Start: 2023-09-01 — End: 2023-11-30

## 2023-09-01 MED ORDER — METOPROLOL SUCCINATE ER 25 MG PO TB24: 25.0000 mg | ORAL_TABLET | Freq: Every day | ORAL | 3 refills | Status: AC

## 2023-09-01 MED ORDER — EZETIMIBE 10 MG PO TABS: 10.0000 mg | ORAL_TABLET | Freq: Every day | ORAL | 3 refills | Status: AC

## 2023-09-01 MED ORDER — ROSUVASTATIN CALCIUM 40 MG PO TABS: 40.0000 mg | ORAL_TABLET | Freq: Every day | ORAL | 3 refills | Status: AC

## 2023-09-01 NOTE — Telephone Encounter (Signed)
 Patient called to request refills and pharmacy transfer-[facility] to [facility]
